# Patient Record
Sex: Female | Born: 1995 | Race: White | Hispanic: No | Marital: Married | Smoking: Never smoker
Health system: Southern US, Community
[De-identification: ages and names within clinical notes are randomized; demographics above are authoritative.]

## PROBLEM LIST (undated history)

## (undated) ENCOUNTER — Ambulatory Visit: Admission: EM | Payer: BC Managed Care – PPO | Source: Home / Self Care

## (undated) ENCOUNTER — Inpatient Hospital Stay (HOSPITAL_COMMUNITY): Payer: Self-pay

## (undated) DIAGNOSIS — M419 Scoliosis, unspecified: Secondary | ICD-10-CM

## (undated) DIAGNOSIS — K5190 Ulcerative colitis, unspecified, without complications: Secondary | ICD-10-CM

## (undated) DIAGNOSIS — Z8739 Personal history of other diseases of the musculoskeletal system and connective tissue: Secondary | ICD-10-CM

## (undated) DIAGNOSIS — D649 Anemia, unspecified: Secondary | ICD-10-CM

## (undated) DIAGNOSIS — F909 Attention-deficit hyperactivity disorder, unspecified type: Secondary | ICD-10-CM

## (undated) HISTORY — PX: VAGINA SURGERY: SHX829

## (undated) HISTORY — DX: Attention-deficit hyperactivity disorder, unspecified type: F90.9

## (undated) HISTORY — PX: COLON SURGERY: SHX602

---

## 2016-01-18 HISTORY — PX: OSTOMY: SHX5997

## 2016-01-18 HISTORY — PX: ABDOMINAL DEBRIDEMENT: SHX1109

## 2016-11-23 DIAGNOSIS — Z933 Colostomy status: Secondary | ICD-10-CM | POA: Insufficient documentation

## 2018-01-17 HISTORY — PX: OTHER SURGICAL HISTORY: SHX169

## 2018-03-07 DIAGNOSIS — K5090 Crohn's disease, unspecified, without complications: Secondary | ICD-10-CM | POA: Insufficient documentation

## 2018-04-11 ENCOUNTER — Encounter (HOSPITAL_COMMUNITY): Payer: Self-pay | Admitting: Psychiatry

## 2018-04-11 ENCOUNTER — Encounter (INDEPENDENT_AMBULATORY_CARE_PROVIDER_SITE_OTHER): Payer: Self-pay

## 2018-04-11 ENCOUNTER — Ambulatory Visit (INDEPENDENT_AMBULATORY_CARE_PROVIDER_SITE_OTHER): Payer: Medicaid Other | Admitting: Psychiatry

## 2018-04-11 ENCOUNTER — Other Ambulatory Visit: Payer: Self-pay

## 2018-04-11 DIAGNOSIS — F988 Other specified behavioral and emotional disorders with onset usually occurring in childhood and adolescence: Secondary | ICD-10-CM | POA: Diagnosis not present

## 2018-04-11 DIAGNOSIS — F900 Attention-deficit hyperactivity disorder, predominantly inattentive type: Secondary | ICD-10-CM | POA: Diagnosis not present

## 2018-04-11 MED ORDER — LISDEXAMFETAMINE DIMESYLATE 30 MG PO CAPS: 30.0000 mg | ORAL_CAPSULE | ORAL | 0 refills | Status: DC

## 2018-04-11 NOTE — Progress Notes (Signed)
Virtual Visit via Video Note  I connected with Laura Vaughn on 04/11/18 at  2:00 PM EDT by a video enabled telemedicine application and verified that I am speaking with the correct person using two identifiers.   I discussed the limitations of evaluation and management by telemedicine and the availability of in person appointments. The patient expressed understanding and agreed to proceed.    I discussed the assessment and treatment plan with the patient. The patient was provided an opportunity to ask questions and all were answered. The patient agreed with the plan and demonstrated an understanding of the instructions.   The patient was advised to call back or seek an in-person evaluation if the symptoms worsen or if the condition fails to improve as anticipated.  I provided 50 minutes of non-face-to-face time during this encounter.   Diannia Ruder, MD   Psychiatric Initial Adult Assessment   Patient Identification: Laura Vaughn MRN:  [id number] Date of Evaluation:  04/11/2018 Referral Source: Nada Boozer PA Chief Complaint:   Chief Complaint    ADD; Establish Care     Visit Diagnosis:    ICD-10-CM   1. Attention deficit hyperactivity disorder (ADHD), predominantly inattentive type F90.0   2. 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  Right now she is staying in [place] while her husband is going to school but she primarily has lived in [place].  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  Right now her courses are online  The patient was referred by Nada Boozer physician assistant at [facility] for assessment and treatment of possible ADD.  The patient states that she was homeschooled for her entire life as were her 2 sisters.  She states that  she had a very difficult time staying focused and on task and that she "barely graduated" she was constantly arguing with her mom about doing work was very unmotivated and "scatterbrained."  She states that she is always had a great deal of difficulty staying on task.  She was never hyperactive violent or destructive.  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  She did very well in the hands on learning but has been struggling with the online courses.  Because of being a full-time parent she has to do most of her schoolwork on weekends and it takes her all day to do things that should only take a couple of hours.  She is easily distracted and off task.  She would like to get to the point where she can focus and get things done more quickly.  She denies any symptoms of depression although she has been through a rather rough time the last couple of years.  When her son was born she had an uneventful labor and and delivery but developed vaginal pain and swelling and infection in the vulvar area 2 days later.  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  She has since developed Crohn's disease.  She is now on Entyvio infusion to try to get the Crohn's under control so that she can have the reanastomosis of the colon.Marland Kitchen  Despite this she tries to stay upbeat and has  a lot of good support from her husband and family.  She is interested in medication to help her stay focused.  She denies any other medical problems or contraindications her last vital signs at [facility] indicate blood pressure of 108/67 and weight of 177 pounds  Associated Signs/Symptoms: Depression Symptoms:  difficulty concentrating, (Hypo) Manic Symptoms:  Distractibility, Anxiety Symptoms:   Psychotic Symptoms:   PTSD Symptoms: No history of trauma or abuse  Past Psychiatric History:  none  Previous Psychotropic Medications: No   Substance Abuse History in the last 12 months:  No.  Consequences of Substance Abuse: Negative  Past Medical History:  Past Medical History:  Diagnosis Date  . ADHD (attention deficit hyperactivity disorder)     Past Surgical History:  Procedure Laterality Date  . COLON SURGERY    . VAGINA SURGERY      Family Psychiatric History: The paternal grandmother is bipolar.  She thinks her sisters probably also have ADD.  They were never diagnosed  Family History:  Family History  Problem Relation Age of Onset  . ADD / ADHD Sister   . Bipolar disorder Paternal Grandmother   . ADD / ADHD Sister     Social History:   Social History   Socioeconomic History  . Marital status: Not on file    Spouse name: Not on file  . Number of children: Not on file  . Years of education: Not on file  . Highest education level: Not on file  Occupational History  . Not on file  Social Needs  . Financial resource strain: Not on file  . Food insecurity:    Worry: Not on file    Inability: Not on file  . Transportation needs:    Medical: Not on file    Non-medical: Not on file  Tobacco Use  . Smoking status: Never Smoker  . Smokeless tobacco: Never Used  Substance and Sexual Activity  . Alcohol use: Never    Frequency: Never  . Drug use: Never  . Sexual activity: Yes    Birth control/protection: Implant  Lifestyle  . Physical activity:    Days per week: Not on file    Minutes per session: Not on file  . Stress: Not on file  Relationships  . Social connections:    Talks on phone: Not on file    Gets together: Not on file    Attends religious service: Not on file    Active member of club or organization: Not on file    Attends meetings of clubs or organizations: Not on file    Relationship status: Not on file  Other Topics Concern  . Not on file  Social History Narrative  . Not on file    Additional Social History: The patient  grew up in [place] [facility] with both parents and 2 older sisters.  She denies any trauma or abuse during childhood.  She is still very close to her family.  She is married and has 1 son.  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  She is currently doing equine studies through community college program.  Allergies:  No Known Allergies  Metabolic Disorder Labs: No results found for: HGBA1C, MPG No results found for: PROLACTIN No results found for: CHOL, TRIG, HDL, CHOLHDL, VLDL, LDLCALC No results found for: TSH  Therapeutic Level Labs: No results found for: LITHIUM No results found for: CBMZ No results found for: VALPROATE  Current  Medications: Current Outpatient Medications  Medication Sig Dispense Refill  . etonogestrel (NEXPLANON) 68 MG IMPL implant Inject into the skin.    Marland Kitchen lisdexamfetamine (VYVANSE) 30 MG capsule Take 1 capsule (30 mg total) by mouth every morning. 30 capsule 0  . Vedolizumab (ENTYVIO IV) Inject into the vein.     No current facility-administered medications for this visit.     Musculoskeletal: Strength & Muscle Tone: within normal limits Gait & Station: normal Patient leans: N/A  Psychiatric Specialty Exam: Review of Systems  Constitutional: Positive for malaise/fatigue.  All other systems reviewed and are negative.   There were no vitals taken for this visit.There is no height or weight on file to calculate BMI.  General Appearance: Casual and Fairly Groomed  Eye Contact:  Good  Speech:  Clear and Coherent  Volume:  Normal  Mood:  Euthymic  Affect:  Appropriate and Congruent  Thought Process:  Goal Directed  Orientation:  Full (Time, Place, and Person)  Thought Content:  WDL  Suicidal Thoughts:  No  Homicidal Thoughts:  No  Memory:  Immediate;   Good Recent;   Good Remote;   Good  Judgement:  Good  Insight:  Fair  Psychomotor Activity:  Normal  Concentration:  Concentration: Poor and Attention  Span: Poor  Recall:  Good  Fund of Knowledge:Good  Language: Good  Akathisia:  No  Handed:  Right  AIMS (if indicated):  not done  Assets:  Communication Skills Desire for Improvement Resilience Social Support Talents/Skills  ADL's:  Intact  Cognition: WNL  Sleep:  Good   Screenings:   Assessment and Plan: This patient is a 23 year old female with a history of probably undiagnosed ADD.  She struggled with this throughout her school years.  Now that she is taking college level courses and has become much more apparent.  We discussed numerous medication options and decided to try Vyvanse 30 mg every morning as this may cover more of a day.  She will start this medication and return to see me in 4 weeks.  Note that this evaluation was done through tele-psychiatry    Diannia Ruder, MD 3/25/20202:36 PM

## 2018-05-09 ENCOUNTER — Encounter (HOSPITAL_COMMUNITY): Payer: Self-pay | Admitting: Psychiatry

## 2018-05-09 ENCOUNTER — Ambulatory Visit (INDEPENDENT_AMBULATORY_CARE_PROVIDER_SITE_OTHER): Payer: Medicaid Other | Admitting: Psychiatry

## 2018-05-09 ENCOUNTER — Other Ambulatory Visit: Payer: Self-pay

## 2018-05-09 DIAGNOSIS — F900 Attention-deficit hyperactivity disorder, predominantly inattentive type: Secondary | ICD-10-CM | POA: Diagnosis not present

## 2018-05-09 MED ORDER — LISDEXAMFETAMINE DIMESYLATE 30 MG PO CAPS: 30.0000 mg | ORAL_CAPSULE | ORAL | 0 refills | Status: DC

## 2018-05-09 NOTE — Progress Notes (Signed)
Virtual Visit via Video Note  I connected with Laura Vaughn on 05/09/18 at 10:20 AM EDT by a video enabled telemedicine application and verified that I am speaking with the correct person using two identifiers.   I discussed the limitations of evaluation and management by telemedicine and the availability of in person appointments. The patient expressed understanding and agreed to proceed.      I discussed the assessment and treatment plan with the patient. The patient was provided an opportunity to ask questions and all were answered. The patient agreed with the plan and demonstrated an understanding of the instructions.   The patient was advised to call back or seek an in-person evaluation if the symptoms worsen or if the condition fails to improve as anticipated.  I provided 15 minutes of non-face-to-face time during this encounter.   Laura Ruder, MD  [facility] MD/PA/NP OP Progress Note  05/09/2018 10:52 AM Laura Vaughn  MRN:  [id number]  Chief Complaint:  Chief Complaint    ADD; Follow-up     HPI: This patient is a 23 year old married white female who lives with her husband and son 45 months old.  Right now she is staying in [place] while her husband is going to school but she primarily has lived in [place].  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  Right now her courses are online  The patient was referred by Laura Vaughn physician assistant at [facility] for assessment and treatment of possible ADD.  The patient states that she was homeschooled for her entire life as were her 2 sisters.  She states that she had a very difficult time staying focused and on task and that she "barely graduated" she was constantly arguing with her mom about doing work was very unmotivated and "scatterbrained."  She states that she is always had a great deal of difficulty staying on task.  She was never hyperactive  violent or destructive.  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  She did very well in the hands on learning but has been struggling with the online courses.  Because of being a full-time parent she has to do most of her schoolwork on weekends and it takes her all day to do things that should only take a couple of hours.  She is easily distracted and off task.  She would like to get to the point where she can focus and get things done more quickly.  She denies any symptoms of depression although she has been through a rather rough time the last couple of years.  When her son was born she had an uneventful labor and and delivery but developed vaginal pain and swelling and infection in the vulvar area 2 days later.  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  She has since developed Crohn's disease.  She is now on Entyvio infusion to try to get the Crohn's under control so that she can have the reanastomosis of the colon.Marland Kitchen  Despite this she tries to stay upbeat and has a lot of good support from her husband and family.  She is interested in medication to help her stay focused.  She denies any other medical problems or contraindications her last vital signs at [facility] indicate blood pressure of 108/67 and weight of  177 pounds  The patient returns after 4 weeks.  She is seen via video telemedicine due to the coronavirus pandemic.  She states that the Vyvanse 30 mg every morning is helping her focus when she has schoolwork.  She does feel slightly anxious in the evenings when she takes it but it is nothing that she cannot live with and she does not feel overwhelmed by it.  She is not using it every day.  She states that she is going to be staying with her husband in [place] and then they are going to an internship [place] has to [place]  I urged her to find a provider closer to where she is staying but she states her ultimate plan is to return to the [place] area.  For now I suggested that we continue the Vyvanse for the next 3 months at least.  If she is having any further problems or issues she will let me know. Visit Diagnosis:    ICD-10-CM   1. Attention deficit hyperactivity disorder (ADHD), predominantly inattentive type F90.0     Past Psychiatric History: none  Past Medical History:  Past Medical History:  Diagnosis Date  . ADHD (attention deficit hyperactivity disorder)     Past Surgical History:  Procedure Laterality Date  . COLON SURGERY    . VAGINA SURGERY      Family Psychiatric History: see below  Family History:  Family History  Problem Relation Age of Onset  . ADD / ADHD Sister   . Bipolar disorder Paternal Grandmother   . ADD / ADHD Sister     Social History:  Social History   Socioeconomic History  . Marital status: Unknown    Spouse name: Not on file  . Number of children: Not on file  . Years of education: Not on file  . Highest education level: Not on file  Occupational History  . Not on file  Social Needs  . Financial resource strain: Not on file  . Food insecurity:    Worry: Not on file    Inability: Not on file  . Transportation needs:    Medical: Not on file    Non-medical: Not on file  Tobacco Use  . Smoking status: Never Smoker  . Smokeless tobacco: Never Used  Substance and Sexual Activity  . Alcohol use: Never    Frequency: Never  . Drug use: Never  . Sexual activity: Yes    Birth control/protection: Implant  Lifestyle  . Physical activity:    Days per week: Not on file    Minutes per session: Not on file  . Stress: Not on file  Relationships  . Social connections:    Talks on phone: Not on file    Gets together: Not on file    Attends religious service: Not on file    Active member of club or organization: Not on file    Attends meetings of  clubs or organizations: Not on file    Relationship status: Not on file  Other Topics Concern  . Not on file  Social History Narrative  . Not on file    Allergies: No Known Allergies  Metabolic Disorder Labs: No results found for: HGBA1C, MPG No results found for: PROLACTIN No results found for: CHOL, TRIG, HDL, CHOLHDL, VLDL, LDLCALC No results found for: TSH  Therapeutic Level Labs: No results found for: LITHIUM No results found for: VALPROATE No components found for:  CBMZ  Current Medications:  Current Outpatient Medications  Medication Sig Dispense Refill  . etonogestrel (NEXPLANON) 68 MG IMPL implant Inject into the skin.    Marland Kitchen lisdexamfetamine (VYVANSE) 30 MG capsule Take 1 capsule (30 mg total) by mouth every morning. 30 capsule 0  . lisdexamfetamine (VYVANSE) 30 MG capsule Take 1 capsule (30 mg total) by mouth every morning. Fill after 06/07/2018 30 capsule 0  . lisdexamfetamine (VYVANSE) 30 MG capsule Take 1 capsule (30 mg total) by mouth every morning. Fill after 07/08/2018 30 capsule 0  . Vedolizumab (ENTYVIO IV) Inject into the vein.     No current facility-administered medications for this visit.      Musculoskeletal: Strength & Muscle Tone: within normal limits Gait & Station: normal Patient leans: N/A  Psychiatric Specialty Exam: Review of Systems  All other systems reviewed and are negative.   There were no vitals taken for this visit.There is no height or weight on file to calculate BMI.  General Appearance: Casual and Fairly Groomed  Eye Contact:  Good  Speech:  Clear and Coherent  Volume:  Normal  Mood:  Euthymic  Affect:  Appropriate and Congruent  Thought Process:  Goal Directed  Orientation:  Full (Time, Place, and Person)  Thought Conte normal  Suicidal Thoughts:  No  Homicidal Thoughts:  No  Memory:  Immediate;   Good Recent;   Good Remote;   Good  Judgement:  Good  Insight:  Fair  Psychomotor Activity:  Normal  Concentration:   Concentration: Good and Attention Span: Good  Recall:  Good  Fund of Knowledge: Good  Language: Good  Akathisia:  No  Handed:  Right  AIMS (if indicated): not done  Assets:  Communication Skills Desire for Improvement Physical Health Resilience Social Support Talents/Skills  ADL's:  Intact  Cognition: WNL  Sleep:  Good   Screenings:   Assessment and Plan:  This patient is a 23 year old female with a history of ADD.  She is doing well with Vyvanse 30 mg every morning with minimal side effects.  She will continue this dosage and return to see me in 3 months  Laura Ruder, MD 05/09/2018, 10:52 AM

## 2018-08-08 ENCOUNTER — Ambulatory Visit (HOSPITAL_COMMUNITY): Payer: Medicaid Other | Admitting: Psychiatry

## 2018-12-04 LAB — OB RESULTS CONSOLE RPR: RPR: NONREACTIVE

## 2018-12-04 LAB — OB RESULTS CONSOLE ABO/RH: RH Type: POSITIVE

## 2018-12-04 LAB — OB RESULTS CONSOLE ANTIBODY SCREEN: Antibody Screen: NEGATIVE

## 2018-12-04 LAB — OB RESULTS CONSOLE GC/CHLAMYDIA
Chlamydia: NEGATIVE
Gonorrhea: NEGATIVE

## 2018-12-04 LAB — OB RESULTS CONSOLE RUBELLA ANTIBODY, IGM: Rubella: IMMUNE

## 2018-12-04 LAB — OB RESULTS CONSOLE HEPATITIS B SURFACE ANTIGEN: Hepatitis B Surface Ag: NEGATIVE

## 2018-12-04 LAB — OB RESULTS CONSOLE HIV ANTIBODY (ROUTINE TESTING): HIV: NONREACTIVE

## 2019-05-23 ENCOUNTER — Inpatient Hospital Stay (HOSPITAL_COMMUNITY)
Admission: AD | Admit: 2019-05-23 | Discharge: 2019-05-23 | Disposition: A | Discharge status: Home / Self Care | Payer: Medicaid Other | Source: Ambulatory Visit | Attending: Obstetrics and Gynecology | Admitting: Obstetrics and Gynecology

## 2019-05-23 ENCOUNTER — Other Ambulatory Visit: Payer: Self-pay

## 2019-05-23 ENCOUNTER — Encounter (HOSPITAL_COMMUNITY): Payer: Self-pay | Admitting: Obstetrics and Gynecology

## 2019-05-23 DIAGNOSIS — R197 Diarrhea, unspecified: Secondary | ICD-10-CM

## 2019-05-23 DIAGNOSIS — O98813 Other maternal infectious and parasitic diseases complicating pregnancy, third trimester: Secondary | ICD-10-CM

## 2019-05-23 DIAGNOSIS — Z3A33 33 weeks gestation of pregnancy: Secondary | ICD-10-CM

## 2019-05-23 DIAGNOSIS — A084 Viral intestinal infection, unspecified: Secondary | ICD-10-CM | POA: Diagnosis not present

## 2019-05-23 DIAGNOSIS — Z3689 Encounter for other specified antenatal screening: Secondary | ICD-10-CM | POA: Diagnosis not present

## 2019-05-23 DIAGNOSIS — O99891 Other specified diseases and conditions complicating pregnancy: Secondary | ICD-10-CM

## 2019-05-23 DIAGNOSIS — O99613 Diseases of the digestive system complicating pregnancy, third trimester: Secondary | ICD-10-CM | POA: Diagnosis not present

## 2019-05-23 LAB — URINALYSIS, ROUTINE W REFLEX MICROSCOPIC
Bilirubin Urine: NEGATIVE
Glucose, UA: NEGATIVE mg/dL
Hgb urine dipstick: NEGATIVE
Ketones, ur: 20 mg/dL — AB
Nitrite: NEGATIVE
Protein, ur: 100 mg/dL — AB
Specific Gravity, Urine: 1.029 (ref 1.005–1.030)
pH: 6 (ref 5.0–8.0)

## 2019-05-23 LAB — CBC WITH DIFFERENTIAL/PLATELET
Abs Immature Granulocytes: 0.07 10*3/uL (ref 0.00–0.07)
Basophils Absolute: 0 10*3/uL (ref 0.0–0.1)
Basophils Relative: 0 %
Eosinophils Absolute: 0 10*3/uL (ref 0.0–0.5)
Eosinophils Relative: 0 %
HCT: 37.4 % (ref 36.0–46.0)
Hemoglobin: 11.4 g/dL — ABNORMAL LOW (ref 12.0–15.0)
Immature Granulocytes: 1 %
Lymphocytes Relative: 15 %
Lymphs Abs: 1.7 10*3/uL (ref 0.7–4.0)
MCH: 27.5 pg (ref 26.0–34.0)
MCHC: 30.5 g/dL (ref 30.0–36.0)
MCV: 90.3 fL (ref 80.0–100.0)
Monocytes Absolute: 0.6 10*3/uL (ref 0.1–1.0)
Monocytes Relative: 6 %
Neutro Abs: 8.4 10*3/uL — ABNORMAL HIGH (ref 1.7–7.7)
Neutrophils Relative %: 78 %
Platelets: 425 10*3/uL — ABNORMAL HIGH (ref 150–400)
RBC: 4.14 MIL/uL (ref 3.87–5.11)
RDW: 14.2 % (ref 11.5–15.5)
WBC: 10.8 10*3/uL — ABNORMAL HIGH (ref 4.0–10.5)
nRBC: 0 % (ref 0.0–0.2)

## 2019-05-23 LAB — COMPREHENSIVE METABOLIC PANEL
ALT: 15 U/L (ref 0–44)
AST: 23 U/L (ref 15–41)
Albumin: 2.5 g/dL — ABNORMAL LOW (ref 3.5–5.0)
Alkaline Phosphatase: 112 U/L (ref 38–126)
Anion gap: 7 (ref 5–15)
BUN: 6 mg/dL (ref 6–20)
CO2: 21 mmol/L — ABNORMAL LOW (ref 22–32)
Calcium: 8 mg/dL — ABNORMAL LOW (ref 8.9–10.3)
Chloride: 107 mmol/L (ref 98–111)
Creatinine, Ser: 0.68 mg/dL (ref 0.44–1.00)
GFR calc Af Amer: >60 mL/min (ref >60)
GFR calc non Af Amer: >60 mL/min (ref >60)
Glucose, Bld: 84 mg/dL (ref 70–99)
Potassium: 3.8 mmol/L (ref 3.5–5.1)
Sodium: 135 mmol/L (ref 135–145)
Total Bilirubin: 0.5 mg/dL (ref 0.3–1.2)
Total Protein: 6.6 g/dL (ref 6.5–8.1)

## 2019-05-23 MED ORDER — LACTATED RINGERS IV BOLUS
1000.0000 mL | Freq: Once | INTRAVENOUS | Status: AC
  Administered 2019-05-23: 17:00:00 1000 mL via INTRAVENOUS

## 2019-05-23 MED ORDER — DIPHENOXYLATE-ATROPINE 2.5-0.025 MG PO TABS: 1.0000 | ORAL_TABLET | Freq: Four times a day (QID) | ORAL | 0 refills | Status: DC | PRN

## 2019-05-23 MED ORDER — M.V.I. ADULT IV INJ
Freq: Once | INTRAVENOUS | Status: AC
  Filled 2019-05-23: qty 10

## 2019-05-23 NOTE — MAU Note (Signed)
Got a stomach bug on Monday, no longer throwing up; but stomach is in knots.  Still having diarrhea. Is concerned that it may be c-diff, has had it before. (watery stools, 3-4/day)

## 2019-05-23 NOTE — MAU Provider Note (Signed)
History     CSN: [id number]  Arrival date and time: 05/23/19 1331   First Provider Initiated Contact with Patient 05/23/19 1554      Chief Complaint  Patient presents with  . Abdominal Pain  . Diarrhea   Laura Vaughn is a 24 y.o. G2P1 at 48w6dwho presents to MAU with complaints of diarrhea. Patient reports she has a stomach bug on Monday and was having N/V and diarrhea. She reports nausea and vomiting resolved on Tuesday. Since then diarrhea has continued. She reports 3-4 occurrences of diarrhea a day since Monday. Has a hx of C diff 1-2 years ago per patient so is concerned that it may be C diff. She denies recent use of antibiotics or change in medication. Patient has a hx of ulcerative colitis. She reports +FM. She denies abdominal pain, pelvic pain or contractions. Patient reports stomach is in "knots since stomach bug". Patient reports she is tired and weak like she is dehydrated.    OB History    Gravida  2   Para  1   Term  1   Preterm      AB      Living  1     SAB      TAB      Ectopic      Multiple      Live Births  1           Past Medical History:  Diagnosis Date  . ADHD (attention deficit hyperactivity disorder)     Past Surgical History:  Procedure Laterality Date  . ABDOMINAL DEBRIDEMENT  2018   x7  . COLON SURGERY    . OSTOMY  2018  . ostomy reversal  2020  . VAGINA SURGERY      Family History  Problem Relation Age of Onset  . ADD / ADHD Sister   . Bipolar disorder Paternal Grandmother   . ADD / ADHD Sister     Social History   Tobacco Use  . Smoking status: Never Smoker  . Smokeless tobacco: Never Used  Substance Use Topics  . Alcohol use: Never  . Drug use: Never    Allergies: No Known Allergies  Medications Prior to Admission  Medication Sig Dispense Refill Last Dose  . acetaminophen (TYLENOL) 325 MG tablet Take 650 mg by mouth at bedtime.   05/22/2019 at Unknown time  . mesalamine (LIALDA) 1.2 g EC tablet Take 1.2 g  by mouth daily with breakfast.   05/22/2019 at Unknown time  . Prenatal Vit-Fe Fumarate-FA (PRENATAL PO) Take 1 tablet by mouth daily.   05/22/2019 at Unknown time  . lisdexamfetamine (VYVANSE) 30 MG capsule Take 1 capsule (30 mg total) by mouth every morning. (Patient not taking: Reported on 05/23/2019) 30 capsule 0 Not Taking at Unknown time  . lisdexamfetamine (VYVANSE) 30 MG capsule Take 1 capsule (30 mg total) by mouth every morning. Fill after 06/07/2018 (Patient not taking: Reported on 05/23/2019) 30 capsule 0 Not Taking at Unknown time  . lisdexamfetamine (VYVANSE) 30 MG capsule Take 1 capsule (30 mg total) by mouth every morning. Fill after 07/08/2018 (Patient not taking: Reported on 05/23/2019) 30 capsule 0 Not Taking at Unknown time    Review of Systems  Constitutional: Negative.   Respiratory: Negative.   Cardiovascular: Negative.   Gastrointestinal: Positive for diarrhea. Negative for abdominal pain, nausea and vomiting.  Genitourinary: Negative.   Musculoskeletal: Negative.   Neurological: Positive for weakness. Negative for dizziness, light-headedness and headaches.  Psychiatric/Behavioral: Negative.    Physical Exam   Blood pressure 103/63, pulse 95, temperature 98.4 F (36.9 C), temperature source Oral, resp. rate 16, height 5' 8"  (1.727 m), weight 90.2 kg, SpO2 100 %.  Physical Exam  Nursing note and vitals reviewed. Constitutional: She is oriented to person, place, and time. She appears well-developed and well-nourished. No distress.  Cardiovascular: Normal rate and regular rhythm.  Respiratory: Effort normal and breath sounds normal. No respiratory distress. She has no wheezes. She has no rales.  GI: Soft. There is no abdominal tenderness. There is no rebound and no guarding.  Gravid appropriate for gestational age  Musculoskeletal:        General: No edema. Normal range of motion.  Neurological: She is alert and oriented to person, place, and time.  Skin: Skin is warm and  dry. There is pallor.  Psychiatric: She has a normal mood and affect. Her behavior is normal. Thought content normal.   FHR: 130/moderate/+accels/ no decelerations  Toco: no UC, UI prior to rehydration   MAU Course  Procedures  MDM Orders Placed This Encounter  Procedures  . Urinalysis, Routine w reflex microscopic  . CBC with Differential/Platelet  . Comprehensive metabolic panel  . Enteric precautions (UV disinfection)  . Insert peripheral IV   Meds ordered this encounter  Medications  . lactated ringers bolus 1,000 mL  . multivitamins adult (INFUVITE ADULT) 10 mL in lactated ringers 1,000 mL infusion  . diphenoxylate-atropine (LOMOTIL) 2.5-0.025 MG tablet    Sig: Take 1 tablet by mouth 4 (four) times daily as needed for diarrhea or loose stools.    Dispense:  15 tablet    Refill:  0    Order Specific Question:   Supervising Provider    Answer:   Verita Schneiders A [[id number]]  Labs reviewed and WNL  UA ketones present  NST reactive and reassuring   Treatments in MAU including IV LR bolus and MV bag for rehydration. Patient reports last occurrence of diarrhea this morning. Has not had any occurrence since being in MAU- unlikely to be C Diff.   Discussed reasons to return to MAU. Follow up as scheduled in the office. Return to MAU as needed. Pt stable at time of discharge. Rx for Lomotil given to patient for home use if diarrhea continues. Encouraged patient to eat BRAT diet until diarrhea resolves   Assessment and Plan   1. Diarrhea with dehydration   2. [redacted] weeks gestation of pregnancy   3. NST (non-stress test) reactive   4. Viral gastroenteritis    Discharge home Follow up as scheduled in the office for prenatal care Return to MAU as needed for reasons discussed and/or emergencies  Rx for Lomotil   Follow-up Information    [facility] Go on 05/30/2019.   Why: Follow up as scheduled for prenatal care and return to MAU as needed  Contact  information: [address]  [address] [place] [address] [phone]          Allergies as of 05/23/2019   No Known Allergies     Medication List    STOP taking these medications   lisdexamfetamine 30 MG capsule Commonly known as: Vyvanse     TAKE these medications   acetaminophen 325 MG tablet Commonly known as: TYLENOL Take 650 mg by mouth at bedtime.   diphenoxylate-atropine 2.5-0.025 MG tablet Commonly known as: Lomotil Take 1 tablet by mouth 4 (four) times daily as needed for diarrhea or loose stools.  mesalamine 1.2 g EC tablet Commonly known as: LIALDA Take 1.2 g by mouth daily with breakfast.   PRENATAL PO Take 1 tablet by mouth daily.       Lajean Manes CNM 05/23/2019, 5:52 PM

## 2019-06-13 LAB — OB RESULTS CONSOLE GBS: GBS: NEGATIVE

## 2019-06-21 ENCOUNTER — Encounter (HOSPITAL_COMMUNITY): Payer: Self-pay

## 2019-06-21 ENCOUNTER — Telehealth (HOSPITAL_COMMUNITY): Payer: Self-pay | Admitting: *Deleted

## 2019-06-21 NOTE — Patient Instructions (Addendum)
Laura Vaughn  06/21/2019   Your procedure is scheduled on:  06/28/2019  Arrive at [place] at [employer] [facility] on [address] at [facility]  and [employer]. You are invited to use the FREE valet parking or use the Visitor's parking deck.  Pick up the phone at the desk and dial [phone].  Call this number if you have problems the morning of surgery: [phone]  Remember:   Do not eat food:(After Midnight) Desps de medianoche.  Do not drink clear liquids: (After Midnight) Desps de medianoche.  Take these medicines the morning of surgery with A SIP OF WATER:  Do not take Lialda on the day of surgery.   Do not wear jewelry, make-up or nail polish.  Do not wear lotions, powders, or perfumes. Do not wear deodorant.  Do not shave 48 hours prior to surgery.  Do not bring valuables to the hospital.  [facility] is not   responsible for any belongings or valuables brought to the hospital.  Contacts, dentures or bridgework may not be worn into surgery.  Leave suitcase in the car. After surgery it may be brought to your room.  For patients admitted to the hospital, checkout time is 11:00 AM the day of              discharge.      Please read over the following fact sheets that you were given:     Preparing for Surgery

## 2019-06-21 NOTE — Telephone Encounter (Signed)
Preadmission screen  

## 2019-06-21 NOTE — Pre-Procedure Instructions (Signed)
Per Dr Laura Vaughn pt is not to take lialda on the day of surgery.  Will resume day after surgery

## 2019-06-24 ENCOUNTER — Encounter (HOSPITAL_COMMUNITY): Payer: Self-pay

## 2019-06-26 ENCOUNTER — Other Ambulatory Visit (HOSPITAL_COMMUNITY)
Admission: RE | Admit: 2019-06-26 | Discharge: 2019-06-26 | Disposition: A | Discharge status: Home / Self Care | Payer: Commercial Managed Care - PPO | Source: Ambulatory Visit | Attending: Obstetrics and Gynecology | Admitting: Obstetrics and Gynecology

## 2019-06-26 HISTORY — DX: Ulcerative colitis, unspecified, without complications: K51.90

## 2019-06-26 HISTORY — DX: Anemia, unspecified: D64.9

## 2019-06-26 HISTORY — DX: Personal history of other diseases of the musculoskeletal system and connective tissue: Z87.39

## 2019-06-27 ENCOUNTER — Other Ambulatory Visit: Payer: Self-pay

## 2019-06-27 ENCOUNTER — Encounter (HOSPITAL_COMMUNITY): Payer: Self-pay | Admitting: Obstetrics and Gynecology

## 2019-06-27 ENCOUNTER — Other Ambulatory Visit (HOSPITAL_COMMUNITY)
Admission: RE | Admit: 2019-06-27 | Discharge: 2019-06-27 | Disposition: A | Discharge status: Home / Self Care | Payer: Commercial Managed Care - PPO | Source: Ambulatory Visit | Attending: Obstetrics and Gynecology | Admitting: Obstetrics and Gynecology

## 2019-06-27 DIAGNOSIS — Z20822 Contact with and (suspected) exposure to covid-19: Secondary | ICD-10-CM | POA: Insufficient documentation

## 2019-06-27 LAB — CBC
HCT: 35.6 % — ABNORMAL LOW (ref 36.0–46.0)
Hemoglobin: 11.1 g/dL — ABNORMAL LOW (ref 12.0–15.0)
MCH: 26.4 pg (ref 26.0–34.0)
MCHC: 31.2 g/dL (ref 30.0–36.0)
MCV: 84.6 fL (ref 80.0–100.0)
Platelets: 512 10*3/uL — ABNORMAL HIGH (ref 150–400)
RBC: 4.21 MIL/uL (ref 3.87–5.11)
RDW: 15.5 % (ref 11.5–15.5)
WBC: 13.3 10*3/uL — ABNORMAL HIGH (ref 4.0–10.5)
nRBC: 0 % (ref 0.0–0.2)

## 2019-06-27 LAB — ABO/RH: ABO/RH(D): O POS

## 2019-06-27 LAB — RPR: RPR Ser Ql: NONREACTIVE

## 2019-06-27 LAB — TYPE AND SCREEN
ABO/RH(D): O POS
Antibody Screen: NEGATIVE

## 2019-06-27 LAB — SARS CORONAVIRUS 2 (TAT 6-24 HRS): SARS Coronavirus 2: NEGATIVE

## 2019-06-27 NOTE — MAU Note (Signed)
Asymptomatic, swab collected. Lab here for blood work

## 2019-06-27 NOTE — H&P (Signed)
Laura Vaughn is a 24 y.o. female, G2 P1001, EGA [redacted] weeks with EDC 6-18 presenting for primary c-section.  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  Pregnancy has been otherwise complicated by ulcerative colitis fairly well controlled with Lialda.  OB History    Gravida  2   Para  1   Term  1   Preterm      AB      Living  1     SAB      TAB      Ectopic      Multiple      Live Births  1          Past Medical History:  Diagnosis Date  . ADHD (attention deficit hyperactivity disorder)   . Anemia   . History of necrotizing fasciitis    vulvar and vaginal after last delivery  . Ulcerative colitis [facility])    Past Surgical History:  Procedure Laterality Date  . ABDOMINAL DEBRIDEMENT  2018   x7  . COLON SURGERY    . OSTOMY  2018  . ostomy reversal  2020  . VAGINA SURGERY     Family History: family history includes ADD / ADHD in her sister and sister; Bipolar disorder in her paternal grandmother; Diabetes in her father. Social History:  reports that she has never smoked. She has never used smokeless tobacco. She reports that she does not drink alcohol and does not use drugs.     Maternal Diabetes: No Genetic Screening: Normal Maternal Ultrasounds/Referrals: Normal Fetal Ultrasounds or other Referrals:  None Maternal Substance Abuse:  No Significant Maternal Medications:  Meds include: Other:  Significant Maternal Lab Results:  Group B Strep negative Other Comments:  Lialda for UC  Review of Systems  Respiratory: Negative.   Cardiovascular: Negative.    Maternal Medical History:  Fetal activity: Perceived fetal activity is normal.    Prenatal complications: no prenatal complications Prenatal Complications - Diabetes: none.      There were no vitals taken for this visit. Maternal Exam:  Abdomen: Patient reports no  abdominal tenderness. Fetal presentation: vertex  Introitus: Normal vagina.  Amniotic fluid character: not assessed. Scarring from previous partial vulvovaginectomy  Pelvis: adequate for delivery.      Physical Exam  Cardiovascular: Normal rate, regular rhythm and normal heart sounds.  No murmur heard. Respiratory: Effort normal and breath sounds normal.  GI: Soft. Normal appearance.  Musculoskeletal:     Cervical back: Neck supple.  Neurological: She is alert.    Prenatal labs: ABO, Rh: --/--/O POS, O POS Performed at [facility], [place] [address] [place], [place] [address]  [phone]) Antibody: NEG (06/10 0813) Rubella: Immune (11/17 0000) RPR: NON REACTIVE (06/10 0813)  HBsAg: Negative (11/17 0000)  HIV: Non-reactive (11/17 0000)  GBS: Negative/-- (05/27 0000)   Assessment/Plan: IUP at 39 weeks, previous vulvovaginal necrotizing fasciitis requiring partial vulvovaginectomy and diverting colostomy.  Discussed c-section procedure and risks, admitting for elective c-section.   Laura Vaughn 06/27/2019, 6:54 PM

## 2019-06-28 ENCOUNTER — Other Ambulatory Visit: Payer: Self-pay

## 2019-06-28 ENCOUNTER — Inpatient Hospital Stay (HOSPITAL_COMMUNITY)
Admission: RE | Admit: 2019-06-28 | Discharge: 2019-06-30 | DRG: 787 | Disposition: A | Discharge status: Home / Self Care | Payer: Commercial Managed Care - PPO | Attending: Obstetrics and Gynecology | Admitting: Obstetrics and Gynecology

## 2019-06-28 ENCOUNTER — Inpatient Hospital Stay (HOSPITAL_COMMUNITY): Payer: Commercial Managed Care - PPO

## 2019-06-28 ENCOUNTER — Encounter (HOSPITAL_COMMUNITY): Payer: Self-pay | Admitting: Obstetrics and Gynecology

## 2019-06-28 ENCOUNTER — Encounter (HOSPITAL_COMMUNITY)
Admission: RE | Disposition: A | Discharge status: Home / Self Care | Payer: Self-pay | Source: Home / Self Care | Attending: Obstetrics and Gynecology

## 2019-06-28 DIAGNOSIS — Z933 Colostomy status: Secondary | ICD-10-CM

## 2019-06-28 DIAGNOSIS — K5190 Ulcerative colitis, unspecified, without complications: Secondary | ICD-10-CM | POA: Diagnosis present

## 2019-06-28 DIAGNOSIS — Z3A39 39 weeks gestation of pregnancy: Secondary | ICD-10-CM | POA: Diagnosis not present

## 2019-06-28 DIAGNOSIS — O9962 Diseases of the digestive system complicating childbirth: Secondary | ICD-10-CM | POA: Diagnosis present

## 2019-06-28 DIAGNOSIS — D62 Acute posthemorrhagic anemia: Secondary | ICD-10-CM | POA: Diagnosis not present

## 2019-06-28 DIAGNOSIS — Z20822 Contact with and (suspected) exposure to covid-19: Secondary | ICD-10-CM | POA: Diagnosis present

## 2019-06-28 DIAGNOSIS — Z98891 History of uterine scar from previous surgery: Secondary | ICD-10-CM

## 2019-06-28 DIAGNOSIS — O9081 Anemia of the puerperium: Secondary | ICD-10-CM | POA: Diagnosis not present

## 2019-06-28 DIAGNOSIS — O26893 Other specified pregnancy related conditions, third trimester: Principal | ICD-10-CM | POA: Diagnosis present

## 2019-06-28 SURGERY — Surgical Case
Anesthesia: Spinal

## 2019-06-28 MED ORDER — SCOPOLAMINE 1 MG/3DAYS TD PT72
MEDICATED_PATCH | TRANSDERMAL | Status: AC
  Filled 2019-06-28: qty 1

## 2019-06-28 MED ORDER — STERILE WATER FOR IRRIGATION IR SOLN
Status: DC | PRN
  Administered 2019-06-28: 1000 mL

## 2019-06-28 MED ORDER — COCONUT OIL OIL: 1.0000 "application " | TOPICAL_OIL | Status: DC | PRN

## 2019-06-28 MED ORDER — SODIUM CHLORIDE 0.9 % IV SOLN
INTRAVENOUS | Status: DC | PRN
Start: 2019-06-28 — End: 2019-06-28

## 2019-06-28 MED ORDER — DIPHENHYDRAMINE HCL 25 MG PO CAPS: 25.0000 mg | ORAL_CAPSULE | ORAL | Status: DC | PRN

## 2019-06-28 MED ORDER — DIPHENHYDRAMINE HCL 25 MG PO CAPS: 25.0000 mg | ORAL_CAPSULE | Freq: Four times a day (QID) | ORAL | Status: DC | PRN

## 2019-06-28 MED ORDER — NALBUPHINE HCL 10 MG/ML IJ SOLN: 5.0000 mg | INTRAMUSCULAR | Status: DC | PRN

## 2019-06-28 MED ORDER — OXYTOCIN-SODIUM CHLORIDE 30-0.9 UT/500ML-% IV SOLN
INTRAVENOUS | Status: AC
  Filled 2019-06-28: qty 500

## 2019-06-28 MED ORDER — DIPHENHYDRAMINE HCL 50 MG/ML IJ SOLN
INTRAMUSCULAR | Status: AC
  Filled 2019-06-28: qty 1

## 2019-06-28 MED ORDER — LACTATED RINGERS IV SOLN: INTRAVENOUS | Status: DC

## 2019-06-28 MED ORDER — ONDANSETRON HCL 4 MG/2ML IJ SOLN: 4.0000 mg | Freq: Three times a day (TID) | INTRAMUSCULAR | Status: DC | PRN

## 2019-06-28 MED ORDER — SIMETHICONE 80 MG PO CHEW
80.0000 mg | CHEWABLE_TABLET | ORAL | Status: DC | PRN
  Administered 2019-06-29: 80 mg via ORAL
  Filled 2019-06-28: qty 1

## 2019-06-28 MED ORDER — OXYCODONE HCL 5 MG/5ML PO SOLN: 5.0000 mg | Freq: Once | ORAL | Status: DC | PRN

## 2019-06-28 MED ORDER — ACETAMINOPHEN 500 MG PO TABS
1000.0000 mg | ORAL_TABLET | Freq: Four times a day (QID) | ORAL | Status: DC
  Administered 2019-06-28 – 2019-06-30 (×7): 1000 mg via ORAL
  Filled 2019-06-28 (×8): qty 2

## 2019-06-28 MED ORDER — METHYLERGONOVINE MALEATE 0.2 MG/ML IJ SOLN: 0.2000 mg | INTRAMUSCULAR | Status: DC | PRN

## 2019-06-28 MED ORDER — FENTANYL CITRATE (PF) 100 MCG/2ML IJ SOLN
INTRAMUSCULAR | Status: DC | PRN
  Administered 2019-06-28: 15 ug via INTRATHECAL

## 2019-06-28 MED ORDER — KETOROLAC TROMETHAMINE 30 MG/ML IJ SOLN
30.0000 mg | Freq: Once | INTRAMUSCULAR | Status: AC | PRN
  Administered 2019-06-28: 30 mg via INTRAVENOUS

## 2019-06-28 MED ORDER — MENTHOL 3 MG MT LOZG: 1.0000 | LOZENGE | OROMUCOSAL | Status: DC | PRN

## 2019-06-28 MED ORDER — ONDANSETRON HCL 4 MG/2ML IJ SOLN
INTRAMUSCULAR | Status: DC | PRN
  Administered 2019-06-28: 4 mg via INTRAVENOUS

## 2019-06-28 MED ORDER — DEXAMETHASONE SODIUM PHOSPHATE 10 MG/ML IJ SOLN
INTRAMUSCULAR | Status: DC | PRN
  Administered 2019-06-28: 10 mg via INTRAVENOUS

## 2019-06-28 MED ORDER — FENTANYL CITRATE (PF) 100 MCG/2ML IJ SOLN
INTRAMUSCULAR | Status: AC
  Filled 2019-06-28: qty 2

## 2019-06-28 MED ORDER — SODIUM CHLORIDE 0.9 % IR SOLN
Status: DC | PRN
  Administered 2019-06-28: 1

## 2019-06-28 MED ORDER — IBUPROFEN 800 MG PO TABS
800.0000 mg | ORAL_TABLET | Freq: Three times a day (TID) | ORAL | Status: DC
  Administered 2019-06-29 – 2019-06-30 (×4): 800 mg via ORAL
  Filled 2019-06-28: qty 4
  Filled 2019-06-28 (×3): qty 1

## 2019-06-28 MED ORDER — TETANUS-DIPHTH-ACELL PERTUSSIS 5-2.5-18.5 LF-MCG/0.5 IM SUSP: 0.5000 mL | Freq: Once | INTRAMUSCULAR | Status: DC

## 2019-06-28 MED ORDER — NALBUPHINE HCL 10 MG/ML IJ SOLN: 5.0000 mg | Freq: Once | INTRAMUSCULAR | Status: AC | PRN

## 2019-06-28 MED ORDER — WITCH HAZEL-GLYCERIN EX PADS: 1.0000 "application " | MEDICATED_PAD | CUTANEOUS | Status: DC | PRN

## 2019-06-28 MED ORDER — OXYTOCIN-SODIUM CHLORIDE 30-0.9 UT/500ML-% IV SOLN
INTRAVENOUS | Status: DC | PRN
  Administered 2019-06-28: 30 [IU] via INTRAVENOUS

## 2019-06-28 MED ORDER — MEASLES, MUMPS & RUBELLA VAC IJ SOLR: 0.5000 mL | Freq: Once | INTRAMUSCULAR | Status: DC

## 2019-06-28 MED ORDER — KETOROLAC TROMETHAMINE 30 MG/ML IJ SOLN
30.0000 mg | Freq: Four times a day (QID) | INTRAMUSCULAR | Status: AC
  Administered 2019-06-28 – 2019-06-29 (×2): 30 mg via INTRAVENOUS
  Filled 2019-06-28 (×2): qty 1

## 2019-06-28 MED ORDER — NALOXONE HCL 0.4 MG/ML IJ SOLN: 0.4000 mg | INTRAMUSCULAR | Status: DC | PRN

## 2019-06-28 MED ORDER — SENNOSIDES-DOCUSATE SODIUM 8.6-50 MG PO TABS
2.0000 | ORAL_TABLET | ORAL | Status: DC
  Administered 2019-06-29 (×2): 2 via ORAL
  Filled 2019-06-28 (×2): qty 2

## 2019-06-28 MED ORDER — DIPHENHYDRAMINE HCL 50 MG/ML IJ SOLN
12.5000 mg | INTRAMUSCULAR | Status: DC | PRN
  Administered 2019-06-28: 12.5 mg via INTRAVENOUS

## 2019-06-28 MED ORDER — OXYTOCIN-SODIUM CHLORIDE 30-0.9 UT/500ML-% IV SOLN
2.5000 [IU]/h | INTRAVENOUS | Status: DC
  Administered 2019-06-28: 2.5 [IU]/h via INTRAVENOUS

## 2019-06-28 MED ORDER — NALOXONE HCL 4 MG/10ML IJ SOLN
1.0000 ug/kg/h | INTRAVENOUS | Status: DC | PRN
  Filled 2019-06-28: qty 5

## 2019-06-28 MED ORDER — PHENYLEPHRINE HCL-NACL 20-0.9 MG/250ML-% IV SOLN
INTRAVENOUS | Status: DC | PRN
  Administered 2019-06-28: 60 ug/min via INTRAVENOUS

## 2019-06-28 MED ORDER — PRENATAL MULTIVITAMIN CH
1.0000 | ORAL_TABLET | Freq: Every day | ORAL | Status: DC
  Administered 2019-06-28 – 2019-06-30 (×3): 1 via ORAL
  Filled 2019-06-28 (×3): qty 1

## 2019-06-28 MED ORDER — DEXAMETHASONE SODIUM PHOSPHATE 10 MG/ML IJ SOLN
INTRAMUSCULAR | Status: AC
  Filled 2019-06-28: qty 1

## 2019-06-28 MED ORDER — KETOROLAC TROMETHAMINE 30 MG/ML IJ SOLN
INTRAMUSCULAR | Status: AC
  Filled 2019-06-28: qty 1

## 2019-06-28 MED ORDER — HYDROMORPHONE HCL 1 MG/ML IJ SOLN: 0.2500 mg | INTRAMUSCULAR | Status: DC | PRN

## 2019-06-28 MED ORDER — CEFAZOLIN SODIUM-DEXTROSE 2-4 GM/100ML-% IV SOLN
INTRAVENOUS | Status: AC
  Filled 2019-06-28: qty 100

## 2019-06-28 MED ORDER — MEPERIDINE HCL 25 MG/ML IJ SOLN: 6.2500 mg | INTRAMUSCULAR | Status: DC | PRN

## 2019-06-28 MED ORDER — OXYCODONE HCL 5 MG PO TABS: 5.0000 mg | ORAL_TABLET | Freq: Once | ORAL | Status: DC | PRN

## 2019-06-28 MED ORDER — MESALAMINE 1.2 G PO TBEC
4.8000 g | DELAYED_RELEASE_TABLET | Freq: Every day | ORAL | Status: DC
  Filled 2019-06-28 (×3): qty 4

## 2019-06-28 MED ORDER — MAGNESIUM HYDROXIDE 400 MG/5ML PO SUSP: 30.0000 mL | ORAL | Status: DC | PRN

## 2019-06-28 MED ORDER — DIBUCAINE (PERIANAL) 1 % EX OINT: 1.0000 "application " | TOPICAL_OINTMENT | CUTANEOUS | Status: DC | PRN

## 2019-06-28 MED ORDER — BUPIVACAINE IN DEXTROSE 0.75-8.25 % IT SOLN
INTRATHECAL | Status: DC | PRN
  Administered 2019-06-28: 1.6 mL via INTRATHECAL

## 2019-06-28 MED ORDER — LACTATED RINGERS IV SOLN: INTRAVENOUS | Status: DC | PRN

## 2019-06-28 MED ORDER — NALBUPHINE HCL 10 MG/ML IJ SOLN
INTRAMUSCULAR | Status: AC
  Filled 2019-06-28: qty 1

## 2019-06-28 MED ORDER — MORPHINE SULFATE (PF) 0.5 MG/ML IJ SOLN
INTRAMUSCULAR | Status: DC | PRN
  Administered 2019-06-28: 150 ug via INTRATHECAL

## 2019-06-28 MED ORDER — SODIUM CHLORIDE 0.9% FLUSH: 3.0000 mL | INTRAVENOUS | Status: DC | PRN

## 2019-06-28 MED ORDER — PROMETHAZINE HCL 25 MG/ML IJ SOLN: 6.2500 mg | INTRAMUSCULAR | Status: DC | PRN

## 2019-06-28 MED ORDER — METHYLERGONOVINE MALEATE 0.2 MG PO TABS: 0.2000 mg | ORAL_TABLET | ORAL | Status: DC | PRN

## 2019-06-28 MED ORDER — ZOLPIDEM TARTRATE 5 MG PO TABS: 5.0000 mg | ORAL_TABLET | Freq: Every evening | ORAL | Status: DC | PRN

## 2019-06-28 MED ORDER — OXYCODONE HCL 5 MG PO TABS
5.0000 mg | ORAL_TABLET | ORAL | Status: DC | PRN
  Administered 2019-06-29 (×4): 5 mg via ORAL
  Administered 2019-06-30 (×2): 10 mg via ORAL
  Filled 2019-06-28 (×2): qty 1
  Filled 2019-06-28: qty 2
  Filled 2019-06-28 (×2): qty 1
  Filled 2019-06-28: qty 2

## 2019-06-28 MED ORDER — CEFAZOLIN SODIUM-DEXTROSE 2-4 GM/100ML-% IV SOLN
2.0000 g | INTRAVENOUS | Status: AC
  Administered 2019-06-28: 2 g via INTRAVENOUS

## 2019-06-28 MED ORDER — SCOPOLAMINE 1 MG/3DAYS TD PT72
1.0000 | MEDICATED_PATCH | Freq: Once | TRANSDERMAL | Status: DC
  Administered 2019-06-28: 1.5 mg via TRANSDERMAL

## 2019-06-28 MED ORDER — NALBUPHINE HCL 10 MG/ML IJ SOLN
5.0000 mg | Freq: Once | INTRAMUSCULAR | Status: AC | PRN
  Administered 2019-06-28: 5 mg via INTRAVENOUS

## 2019-06-28 MED ORDER — PHENYLEPHRINE HCL-NACL 20-0.9 MG/250ML-% IV SOLN
INTRAVENOUS | Status: AC
  Filled 2019-06-28: qty 250

## 2019-06-28 MED ORDER — MORPHINE SULFATE (PF) 0.5 MG/ML IJ SOLN
INTRAMUSCULAR | Status: AC
  Filled 2019-06-28: qty 10

## 2019-06-28 MED ORDER — ONDANSETRON HCL 4 MG/2ML IJ SOLN
INTRAMUSCULAR | Status: AC
  Filled 2019-06-28: qty 2

## 2019-06-28 SURGICAL SUPPLY — 31 items
BENZOIN TINCTURE PRP APPL 2/3 (GAUZE/BANDAGES/DRESSINGS) ×2 IMPLANT
CHLORAPREP W/TINT 26ML (MISCELLANEOUS) ×2 IMPLANT
CLAMP CORD UMBIL (MISCELLANEOUS) IMPLANT
CLOTH BEACON ORANGE TIMEOUT ST (SAFETY) ×2 IMPLANT
CLSR STERI-STRIP ANTIMIC 1/2X4 (GAUZE/BANDAGES/DRESSINGS) ×2 IMPLANT
DRSG OPSITE POSTOP 4X10 (GAUZE/BANDAGES/DRESSINGS) ×2 IMPLANT
ELECT REM PT RETURN 9FT ADLT (ELECTROSURGICAL) ×2
ELECTRODE REM PT RTRN 9FT ADLT (ELECTROSURGICAL) ×1 IMPLANT
EXTRACTOR VACUUM KIWI (MISCELLANEOUS) IMPLANT
EXTRACTOR VACUUM M CUP 4 TUBE (SUCTIONS) IMPLANT
GLOVE BIOGEL PI IND STRL 7.0 (GLOVE) ×1 IMPLANT
GLOVE BIOGEL PI INDICATOR 7.0 (GLOVE) ×1
GLOVE ORTHO TXT STRL SZ7.5 (GLOVE) ×2 IMPLANT
GOWN STRL REUS W/TWL LRG LVL3 (GOWN DISPOSABLE) ×4 IMPLANT
KIT ABG SYR 3ML LUER SLIP (SYRINGE) IMPLANT
NEEDLE HYPO 25X5/8 SAFETYGLIDE (NEEDLE) ×2 IMPLANT
NS IRRIG 1000ML POUR BTL (IV SOLUTION) ×2 IMPLANT
PACK C SECTION WH (CUSTOM PROCEDURE TRAY) ×2 IMPLANT
PAD OB MATERNITY 4.3X12.25 (PERSONAL CARE ITEMS) ×2 IMPLANT
PENCIL SMOKE EVAC W/HOLSTER (ELECTROSURGICAL) ×2 IMPLANT
RTRCTR C-SECT PINK 25CM LRG (MISCELLANEOUS) ×2 IMPLANT
SUT CHROMIC 1 CTX 36 (SUTURE) ×4 IMPLANT
SUT PLAIN 0 NONE (SUTURE) IMPLANT
SUT PLAIN 2 0 XLH (SUTURE) IMPLANT
SUT VIC AB 0 CT1 27 (SUTURE) ×2
SUT VIC AB 0 CT1 27XBRD ANBCTR (SUTURE) ×2 IMPLANT
SUT VIC AB 2-0 CT1 (SUTURE) ×2 IMPLANT
SUT VIC AB 4-0 KS 27 (SUTURE) IMPLANT
TOWEL OR 17X24 6PK STRL BLUE (TOWEL DISPOSABLE) ×2 IMPLANT
TRAY FOLEY W/BAG SLVR 14FR LF (SET/KITS/TRAYS/PACK) ×2 IMPLANT
WATER STERILE IRR 1000ML POUR (IV SOLUTION) ×2 IMPLANT

## 2019-06-28 NOTE — Anesthesia Postprocedure Evaluation (Signed)
Anesthesia Post Note  Patient: Laura Vaughn  Procedure(s) Performed: CESAREAN SECTION (N/A )     Patient location during evaluation: PACU Anesthesia Type: Spinal Level of consciousness: awake and alert Pain management: pain level controlled Vital Signs Assessment: post-procedure vital signs reviewed and stable Respiratory status: spontaneous breathing, nonlabored ventilation and respiratory function stable Cardiovascular status: blood pressure returned to baseline and stable Postop Assessment: no apparent nausea or vomiting Anesthetic complications: no   No complications documented.  Last Vitals:  Vitals:   06/28/19 0915 06/28/19 0930  BP: 91/70 103/69  Pulse: 60 68  Resp: 16 13  Temp:    SpO2: 97% 99%    Last Pain:  Vitals:   06/28/19 0900  TempSrc:   PainSc: 0-No pain   Pain Goal:    LLE Motor Response: Purposeful movement (06/28/19 0900) LLE Sensation: Tingling (06/28/19 0900) RLE Motor Response: Purposeful movement (06/28/19 0900) RLE Sensation: Tingling (06/28/19 0900)     Epidural/Spinal Function Cutaneous sensation: Tingles (06/28/19 0900), Patient able to flex knees: No (06/28/19 0900), Patient able to lift hips off bed: No (06/28/19 0900), Back pain beyond tenderness at insertion site: No (06/28/19 0900), Progressively worsening motor and/or sensory loss: No (06/28/19 0900), Bowel and/or bladder incontinence post epidural: No (06/28/19 0900)  Lynda Rainwater

## 2019-06-28 NOTE — Interval H&P Note (Signed)
History and Physical Interval Note:  06/28/2019 7:13 AM  Laura Vaughn  has presented today for surgery, with the diagnosis of primary c-section for ulcerative colitis.  The various methods of treatment have been discussed with the patient and family. After consideration of risks, benefits and other options for treatment, the patient has consented to  Procedure(s) with comments: CESAREAN SECTION (N/A) - request RNFA as a surgical intervention.  The patient's history has been reviewed, patient examined, no change in status, stable for surgery.  I have reviewed the patient's chart and labs.  Questions were answered to the patient's satisfaction.     Blane Ohara [name]

## 2019-06-28 NOTE — Op Note (Signed)
Preoperative diagnosis: Intrauterine pregnancy at 39 weeks, previous significant perineal trauma Postoperative diagnosis: Same Procedure: Primary low transverse cesarean section without extensions Surgeon: Cheri Fowler M.D. Anesthesia: Spinal  Findings: Patient had normal gravid anatomy and delivered a viable female infant with Apgars of 8 and 10 weight pending Estimated blood loss: 500 cc Specimens: Placenta sent to labor and delivery Complications: None  Procedure in detail: The patient was taken to the operating room and placed in the sitting position. The anesthesiologist instilled spinal anesthesia.  She was then placed in the dorsosupine position with left tilt. Abdomen was then prepped and draped in the usual sterile fashion, and a foley catheter was inserted. The level of her anesthesia was found to be adequate. Abdomen was entered via a standard Pfannenstiel incision. Once the peritoneal cavity was entered the Alexis disposable self-retaining retractor was placed and good visualization was achieved. A 4 cm transverse incision was then made in the lower uterine segment pushing the bladder inferior. Once the uterine cavity was entered the incision was extended digitally, clear amniotic fluid. The fetal vertex was grasped and delivered through the incision atraumatically. Mouth and nares were suctioned. The remainder of the infant then delivered atraumatically. Cord was doubly clamped and cut after one minute and the infant handed to the awaiting pediatric team. Cord blood was obtained. The placenta delivered spontaneously. Uterus was wiped dry with clean lap pad and all clots and debris were removed. Uterine incision was inspected and found to be free of extensions. Uterine incision was closed in 1 layer with running locking #1 Chromic. Tubes and ovaries were inspected and found to be normal. Uterine incision was inspected and found to be hemostatic. Bleeding from serosal edges was controlled  with electrocautery. The Alexis retractor was removed. Subfascial space was irrigated and made hemostatic with electrocautery. Peritoneum was closed with 2-0 Vicryl.  Fascia was closed in running fashion starting at both ends and meeting in the middle with 0 Vicryl. Subcutaneous tissue was then irrigated and made hemostatic with electrocautery. Skin was closed with running 4-0 Vicryl subcuticular suture followed by steri-strips and a sterile dressing. Patient tolerated the procedure well and was taken to the recovery in stable condition. Counts were correct x2, she received Ancef 2 g IV at the beginning of the procedure and she had PAS hose on throughout the procedure.

## 2019-06-28 NOTE — Anesthesia Procedure Notes (Addendum)
Spinal  Patient location during procedure: OB Start time: 06/28/2019 7:18 AM End time: 06/28/2019 7:23 AM Staffing Performed: anesthesiologist  Anesthesiologist: Lynda Rainwater, MD Preanesthetic Checklist Completed: patient identified, IV checked, risks and benefits discussed, surgical consent, monitors and equipment checked, pre-op evaluation and timeout performed Spinal Block Patient position: sitting Prep: DuraPrep and site prepped and draped Patient monitoring: heart rate, cardiac monitor, continuous pulse ox and blood pressure Approach: midline Location: L3-4 Injection technique: single-shot Needle Needle type: Pencan  Needle gauge: 24 G Needle length: 10 cm Assessment Sensory level: T4

## 2019-06-28 NOTE — Anesthesia Preprocedure Evaluation (Signed)
Anesthesia Evaluation  Patient identified by MRN, date of birth, ID band Patient awake    Reviewed: Allergy & Precautions, NPO status , Patient's Chart, lab work & pertinent test results  Airway Mallampati: II  TM Distance: >3 FB Neck ROM: Full    Dental no notable dental hx.    Pulmonary neg pulmonary ROS,    Pulmonary exam normal breath sounds clear to auscultation       Cardiovascular negative cardio ROS Normal cardiovascular exam Rhythm:Regular Rate:Normal     Neuro/Psych negative neurological ROS  negative psych ROS   GI/Hepatic negative GI ROS, Neg liver ROS,   Endo/Other  negative endocrine ROS  Renal/GU negative Renal ROS  negative genitourinary   Musculoskeletal negative musculoskeletal ROS (+)   Abdominal   Peds negative pediatric ROS (+)  Hematology negative hematology ROS (+)   Anesthesia Other Findings Ulcerative Colitis  Reproductive/Obstetrics (+) Pregnancy                             Anesthesia Physical Anesthesia Plan  ASA: III  Anesthesia Plan: Spinal   Post-op Pain Management:    Induction:   PONV Risk Score and Plan: 2 and Ondansetron, Midazolam and Treatment may vary due to age or medical condition  Airway Management Planned: Natural Airway  Additional Equipment:   Intra-op Plan:   Post-operative Plan:   Informed Consent: I have reviewed the patients History and Physical, chart, labs and discussed the procedure including the risks, benefits and alternatives for the proposed anesthesia with the patient or authorized representative who has indicated his/her understanding and acceptance.     Dental advisory given  Plan Discussed with: CRNA  Anesthesia Plan Comments:         Anesthesia Quick Evaluation

## 2019-06-28 NOTE — Transfer of Care (Signed)
Immediate Anesthesia Transfer of Care Note  Patient: Laura Vaughn  Procedure(s) Performed: CESAREAN SECTION (N/A )  Patient Location: PACU  Anesthesia Type:Spinal  Level of Consciousness: awake and alert   Airway & Oxygen Therapy: Patient Spontanous Breathing  Post-op Assessment: Report given to RN  Post vital signs: Reviewed  Last Vitals:  Vitals Value Taken Time  BP    Temp    Pulse    Resp    SpO2      Last Pain:  Vitals:   06/28/19 0542  TempSrc:   PainSc: 0-No pain         Complications: No complications documented.

## 2019-06-28 NOTE — Lactation Note (Signed)
This note was copied from a baby's chart. Lactation Consultation Note  Patient Name: Laura Vaughn [id number] Date: 06/28/2019  Mom is a G2p2 csection delivery of baby Laura Laura Vaughn now 58 hours old.   Mom reports she  did not breastfeed with her first baby because she got a severe infection past delivery and was in the hospital for about 1 month. Mom reports she feels Laura Vaughn is breastfeeding well and has already breastfed three times since delivery.  Mom and Laura Vaughn STS at this time.  Reviewed Understanding Mother and Baby.  Reviewed and gave [facility] Breastfeeding consultation Services handout. Parents deny need for breastfeeding services at this time. Praised decision to breastfeed.  Urged mom to call lactation as needed.    Maternal Data    Feeding Feeding Type: Breast Fed  LATCH Score Latch:  (encouraged mom to call out for latch assessment )                 Interventions    Lactation Tools Discussed/Used     Consult Status      Trek Kimball Thompson Caul 06/28/2019, 3:20 PM

## 2019-06-29 LAB — CBC
HCT: 28.4 % — ABNORMAL LOW (ref 36.0–46.0)
Hemoglobin: 8.9 g/dL — ABNORMAL LOW (ref 12.0–15.0)
MCH: 26.6 pg (ref 26.0–34.0)
MCHC: 31.3 g/dL (ref 30.0–36.0)
MCV: 84.8 fL (ref 80.0–100.0)
Platelets: 378 10*3/uL (ref 150–400)
RBC: 3.35 MIL/uL — ABNORMAL LOW (ref 3.87–5.11)
RDW: 15.5 % (ref 11.5–15.5)
WBC: 17.1 10*3/uL — ABNORMAL HIGH (ref 4.0–10.5)
nRBC: 0.1 % (ref 0.0–0.2)

## 2019-06-29 LAB — BIRTH TISSUE RECOVERY COLLECTION (PLACENTA DONATION)

## 2019-06-29 MED ORDER — FERROUS SULFATE 325 (65 FE) MG PO TABS
325.0000 mg | ORAL_TABLET | Freq: Every day | ORAL | Status: DC
  Administered 2019-06-30: 325 mg via ORAL
  Filled 2019-06-29: qty 1

## 2019-06-29 NOTE — Lactation Note (Signed)
This note was copied from a baby's chart. Lactation Consultation Note  Patient Name: Laura Vaughn [id number] Date: 06/29/2019 Reason for consult: Follow-up assessment;1st time breastfeeding;Term Baby is 27 hours old/6% weight loss.  Mom reports that baby is breastfeeding well.  Discussed cluster feeding on days 2-3.  Reviewed feeding cues and instructed to call for assist prn.  Baby is currently sleeping in FOB's arms.  Mom asking if she should pump in addition to latching baby to breast. Explained that pumping would not be needed as long as baby feeds at least 8 times per day.  Maternal Data    Feeding    LATCH Score                   Interventions    Lactation Tools Discussed/Used     Consult Status Consult Status: Follow-up Date: 06/30/19 Follow-up type: In-patient    Ave Filter 06/29/2019, 11:01 AM

## 2019-06-29 NOTE — Lactation Note (Signed)
This note was copied from a baby's chart. Lactation Consultation Note  Patient Name: Laura Vaughn [id number] Date: 06/29/2019 Reason for consult: Follow-up assessment;Difficult latch;Nipple pain/trauma;Infant weight loss;Term  RN requested LC to assess and assist with latch as Mom has been experiencing pain.  P2 Mom of term baby at 71 hrs old.  Baby at 6% weight loss with good output.    Mom holding baby STS on her chest.  Mom concerned that baby will latch, but only suck a few times before coming off the breast.  Offered to assist.  Baby taken off STS and baby immediately started cueing to eat.    Assisted Mom with cross cradle hold.  Mom has large breasts, with erect nipples and compressible areola.  Areola noted to have some bruising from earlier latches, both nipples have positional stripes on them.    Baby's tongue noted to be cupped in mouth when she was crying.  Unable to identify a tight frenulum, but on finger, baby pushing with her tongue against finger.    Mom using breast support with a U hold, pillow support under baby.  Baby opens and latches, but quickly pushes back after a couple sucks.  Showed FOB how to tug on chin to open mouth and flange lower lip. Tried multiple times to relatch, nipple pinched.  Switched to football hold, and baby continued to do the same thing.  Fussy, tense, latches and then loses the depth on the breast.    Initiated a 24 mm nipple shield, showing Mom how to properly apply.  Nipple pulls well into shield.  Baby able to latch for a more comfortable latch, Mom taught to firmly support her breast during the feeding.  Baby still at times pushing nipple out of her mouth.  Baby did become rhythmic and swallowing identified.  Baby came off after 20 mins appearing contented.  Had Mom return demo applying nipple shield.  Set up DEBP and assisted with first pumping on initiation setting.  27 mm flange on right breast and 24 mm flange on left.  Mom expressing  colostrum.  Mom to ask her RN for assistance with colostrum.  Suggested spoon Maternal Data    Feeding Feeding Type: Breast Fed  LATCH Score Latch: Grasps breast easily, tongue down, lips flanged, rhythmical sucking.  Audible Swallowing: Spontaneous and intermittent  Type of Nipple: Everted at rest and after stimulation  Comfort (Breast/Nipple): Filling, red/small blisters or bruises, mild/mod discomfort  Hold (Positioning): Assistance needed to correctly position infant at breast and maintain latch.  LATCH Score: 8  Interventions Interventions: Breast feeding basics reviewed;Assisted with latch;Skin to skin;Breast massage;Hand express;Breast compression;Adjust position;Support pillows;Position options;Expressed milk;DEBP  Lactation Tools Discussed/Used Tools: Nipple Shields;Pump;Flanges Nipple shield size: 24 Flange Size: 24;27 Breast pump type: Double-Electric Breast Pump WIC Program: No Pump Review: Setup, frequency, and cleaning;Milk Storage Initiated by:: Cipriano Mile RN IBCLC Date initiated:: 06/29/19   Consult Status Consult Status: Follow-up Date: 06/30/19 Follow-up type: In-patient    Laura Vaughn 06/29/2019, 6:51 PM

## 2019-06-29 NOTE — Progress Notes (Signed)
POSTPARTUM POSTOP PROGRESS NOTE  POD #1  Subjective:  No acute events overnight.  Pt denies problems with ambulating, voiding or po intake.  She denies nausea or vomiting.  Pain is well controlled.  She has not had flatus. She has not had bowel movement.  Lochia Minimal. Stopped using Lialda (mesalamine) as she found it ineffective.   Objective: Blood pressure 102/60, pulse 63, temperature 97.9 F (36.6 C), temperature source Oral, resp. rate 20, height 5' 8"  (1.727 m), weight 92.4 kg, SpO2 98 %, unknown if currently breastfeeding.  Physical Exam:  General: alert, cooperative and no distress Lochia:normal flow Chest: CTAB Heart: RRR no m/r/g Abdomen: +BS, soft, nontender Uterine Fundus: firm, 2cm below umbilicus. Honeycomb dressing intact, minimal serosanguinous drainage Extremities: neg edema, neg calf TTP BL, neg Homans BL  Recent Labs    06/27/19 0813 06/29/19 0437  HGB 11.1* 8.9*  HCT 35.6* 28.4*    Assessment/Plan:  ASSESSMENT: Marchia Diguglielmo is a 24 y.o. [id number] s/p PLTCS @ 35w0dfor h/o perineal trauma. PNC c/b ulcerative colitis, partial vulvo-vaginectomy for vaginal nec fasc w/ subsequent diverting colostomy (replaced).   Plan for discharge tomorrow and Breastfeeding  Anemia 2/2 acute blood loss - PO iron/colace   LOS: 1 day

## 2019-06-30 ENCOUNTER — Ambulatory Visit: Payer: Self-pay

## 2019-06-30 MED ORDER — IBUPROFEN 800 MG PO TABS: 800.0000 mg | ORAL_TABLET | Freq: Three times a day (TID) | ORAL | 1 refills | Status: DC | PRN

## 2019-06-30 MED ORDER — FERROUS SULFATE 325 (65 FE) MG PO TABS: 325.0000 mg | ORAL_TABLET | Freq: Every day | ORAL | 1 refills | Status: DC

## 2019-06-30 MED ORDER — DOCUSATE SODIUM 100 MG PO CAPS
100.0000 mg | ORAL_CAPSULE | Freq: Two times a day (BID) | ORAL | 1 refills | Status: DC | PRN
Start: 2019-06-30 — End: 2020-03-05

## 2019-06-30 MED ORDER — OXYCODONE HCL 5 MG PO TABS: 5.0000 mg | ORAL_TABLET | Freq: Four times a day (QID) | ORAL | 0 refills | Status: DC | PRN

## 2019-06-30 NOTE — Progress Notes (Signed)
Oxy given due to previous need for pain control.

## 2019-06-30 NOTE — Lactation Note (Signed)
This note was copied from a baby's chart. Lactation Consultation Note  Patient Name: Girl Joannie Medine Today's Date: 06/30/2019   Mother reports that she feels that breastfeeding is going well.  Mother reports that she is pumping after breastfeeding. Father reports that mother pumped 11  Ml the first time that she pumped and that she has continued to obtain milk when each pumping. Mother advised to protect her milk supply. Mother to hand express. She denies being sore . Mother has no questions or concerns   discussed treatment and prevention of engorgement.   Mother to continue to cue base feed infant and feed at least 8-12 times or more in 24 hours and advised to allow for cluster feeding infant as needed.   Mother to continue to due STS. Mother is aware of available LC services at [facility], [facility]S, OP Dept, and phone # for questions or concerns about breastfeeding.  Mother receptive to all teaching and plan of care.        Maternal Data    Feeding Feeding Type: Breast Fed  LATCH Score                   Interventions    Lactation Tools Discussed/Used Tools: Nipple [place] [employer]   Consult Status      Jess Barters [facility] 06/30/2019, 3:23 PM

## 2019-06-30 NOTE — Discharge Summary (Signed)
   Postpartum Discharge Summary  Date of Service updated     Patient Name: Laura Vaughn DOB: 01/02/1996 MRN: [id number]  Date of admission: 06/28/2019 Delivery date:06/28/2019  Delivering provider: MEISINGER, TODD  Date of discharge: 06/30/2019  Admitting diagnosis: S/P cesarean section [Z98.891] Indication for care in labor or delivery [O75.9] Intrauterine pregnancy: [redacted]w[redacted]d     Secondary diagnosis:  Active Problems:   S/P cesarean section   Indication for care in labor or delivery  Additional problems: H/o vaginal/vulvar necrotizing fasciitis postpartum requiring partial vulvovaginectomy with skin graft, diverting colostomy with eventual reversal    Discharge diagnosis: Term Pregnancy Delivered                                              Post partum procedures:none Complications: None  Hospital course: Sceduled C/S   24 y.o. yo G2P2002 at [redacted]w[redacted]d was admitted to the hospital 06/28/2019 for scheduled cesarean section with the following indication:H/o vaginal/vulvar necrotizing fasciitis postpartum requiring partial vulvovaginectomy with skin graft, diverting colostomy with eventual reversal.Delivery details are as follows:  Membrane Rupture Time/Date: 7:44 AM ,06/28/2019   Delivery Method:C-Section, Low Transverse  Details of operation can be found in separate operative note.  Patient had an uncomplicated postpartum course.  She is ambulating, tolerating a regular diet, passing flatus, and urinating well. Patient is discharged home in stable condition on  06/30/19        Newborn Data: Birth date:06/28/2019  Birth time:7:45 AM  Gender:Female  Living status:Living  Apgars:8 ,10  Weight:3480 g     Magnesium Sulfate received: No BMZ received: No Rhophylac:No MMR:No T-DaP:Given prenatally Flu: Declined in pregnancy Transfusion:No  Physical exam  Vitals:   06/29/19 0859 06/29/19 1307 06/29/19 2146 06/30/19 0623  BP: 102/60 105/68 111/61 105/66  Pulse: 63 61 72 63  Resp: 20 18  17 18  Temp: 97.9 F (36.6 C) 98.5 F (36.9 C) 98.2 F (36.8 C) 98 F (36.7 C)  TempSrc: Oral Oral Oral Oral  SpO2: 98% 100% 99% 99%  Weight:      Height:       General: alert, cooperative and no distress Lochia: appropriate Uterine Fundus: firm Incision: Healing well with no significant drainage, No significant erythema, Dressing is clean, dry, and intact DVT Evaluation: No evidence of DVT seen on physical exam. Negative Homan's sign. No cords or calf tenderness. Labs: Lab Results  Component Value Date   WBC 17.1 (H) 06/29/2019   HGB 8.9 (L) 06/29/2019   HCT 28.4 (L) 06/29/2019   MCV 84.8 06/29/2019   PLT 378 06/29/2019   CMP Latest Ref Rng & Units 05/23/2019  Glucose 70 - 99 mg/dL 84  BUN 6 - 20 mg/dL 6  Creatinine 0.44 - 1.00 mg/dL 0.68  Sodium 135 - 145 mmol/L 135  Potassium 3.5 - 5.1 mmol/L 3.8  Chloride 98 - 111 mmol/L 107  CO2 22 - 32 mmol/L 21(L)  Calcium 8.9 - 10.3 mg/dL 8.0(L)  Total Protein 6.5 - 8.1 g/dL 6.6  Total Bilirubin 0.3 - 1.2 mg/dL 0.5  Alkaline Phos 38 - 126 U/L 112  AST 15 - 41 U/L 23  ALT 0 - 44 U/L 15   Edinburgh Score: Edinburgh Postnatal Depression Scale Screening Tool 06/29/2019  I have been able to laugh and see the funny side of things. 0  I have looked forward with enjoyment to things.   0  I have blamed myself unnecessarily when things went wrong. 1  I have been anxious or worried for no good reason. 1  I have felt scared or panicky for no good reason. 1  Things have been getting on top of me. 0  I have been so unhappy that I have had difficulty sleeping. 0  I have felt sad or miserable. 1  I have been so unhappy that I have been crying. 1  The thought of harming myself has occurred to me. 0  Edinburgh Postnatal Depression Scale Total 5      After visit meds:  Allergies as of 06/30/2019   No Known Allergies     Medication List    STOP taking these medications   acetaminophen 325 MG tablet Commonly known as: TYLENOL    mesalamine 1.2 g EC tablet Commonly known as: LIALDA     TAKE these medications   diphenoxylate-atropine 2.5-0.025 MG tablet Commonly known as: Lomotil Take 1 tablet by mouth 4 (four) times daily as needed for diarrhea or loose stools.   docusate sodium 100 MG capsule Commonly known as: Colace Take 1 capsule (100 mg total) by mouth 2 (two) times daily as needed for mild constipation.   ferrous sulfate 325 (65 FE) MG tablet Take 1 tablet (325 mg total) by mouth daily with breakfast. Start taking on: July 01, 2019   ibuprofen 800 MG tablet Commonly known as: ADVIL Take 1 tablet (800 mg total) by mouth every 8 (eight) hours as needed.   oxyCODONE 5 MG immediate release tablet Commonly known as: Oxy IR/ROXICODONE Take 1 tablet (5 mg total) by mouth every 6 (six) hours as needed for moderate pain or severe pain.   PRENATAL PO Take 1 tablet by mouth daily.            Discharge Care Instructions  (From admission, onward)         Start     Ordered   06/30/19 0000  Leave dressing on - Keep it clean, dry, and intact until clinic visit        06/30/19 1102           Discharge home in stable condition Infant Feeding: Breast Infant Disposition:home with mother Discharge instruction: per After Visit Summary and Postpartum booklet. Activity: Advance as tolerated. Pelvic rest for 6 weeks.  Diet: routine diet Anticipated Birth Control: Unsure Postpartum Appointment:6 weeks Additional Postpartum F/U: Incision check 2wk Future Appointments:No future appointments. Follow up Visit:      06/30/2019 Deliah Boston, MD

## 2019-06-30 NOTE — Progress Notes (Signed)
POSTPARTUM POSTOP PROGRESS NOTE  POD #2  Subjective:  No acute events overnight.  Pt denies problems with ambulating, voiding or po intake.  She denies nausea or vomiting.  Pain is well controlled.  She has had flatus. She has had bowel movement.  Lochia Minimal. Breast feeding improved. Can tolerate PO ibuprofen without issue  Objective: Blood pressure 105/66, pulse 63, temperature 98 F (36.7 C), temperature source Oral, resp. rate 18, height 5' 8"  (1.727 m), weight 92.4 kg, SpO2 99 %, unknown if currently breastfeeding.  Physical Exam:  General: alert, cooperative and no distress Lochia:normal flow Chest: CTAB Heart: RRR no m/r/g Abdomen: +BS, soft, nontender Uterine Fundus: firm, 2cm below umbilicus. Honeycomb dressing intact, minimal serosanguinous drainage Extremities: neg edema, neg calf TTP BL, neg Homans BL  Recent Labs    06/29/19 0437  HGB 8.9*  HCT 28.4*    Assessment/Plan:  ASSESSMENT: Laura Vaughn is a 24 y.o. G3O7564 s/p PLTCS @ 87w0dfor h/o perineal trauma. PNC c/b ulcerative colitis, partial vulvo-vaginectomy for vaginal nec fasc w/ subsequent diverting colostomy (replaced).   Discharge home and Breastfeeding  Anemia 2/2 acute blood loss - PO iron/colace   LOS: 2 days

## 2019-10-15 ENCOUNTER — Emergency Department (HOSPITAL_COMMUNITY)
Admission: EM | Admit: 2019-10-15 | Discharge: 2019-10-15 | Discharge status: Absent without leave | Payer: Commercial Managed Care - PPO

## 2019-10-15 ENCOUNTER — Other Ambulatory Visit: Payer: Self-pay

## 2020-03-05 ENCOUNTER — Ambulatory Visit
Admission: EM | Admit: 2020-03-05 | Discharge: 2020-03-05 | Disposition: A | Discharge status: Home / Self Care | Payer: Commercial Managed Care - PPO

## 2020-03-05 ENCOUNTER — Emergency Department (HOSPITAL_COMMUNITY): Payer: Commercial Managed Care - PPO

## 2020-03-05 ENCOUNTER — Emergency Department (HOSPITAL_COMMUNITY)
Admission: EM | Admit: 2020-03-05 | Discharge: 2020-03-05 | Disposition: A | Discharge status: Home / Self Care | Payer: Commercial Managed Care - PPO | Source: Home / Self Care | Attending: Emergency Medicine | Admitting: Emergency Medicine

## 2020-03-05 ENCOUNTER — Other Ambulatory Visit: Payer: Self-pay

## 2020-03-05 ENCOUNTER — Encounter (HOSPITAL_COMMUNITY): Payer: Self-pay

## 2020-03-05 DIAGNOSIS — Z79899 Other long term (current) drug therapy: Secondary | ICD-10-CM | POA: Insufficient documentation

## 2020-03-05 DIAGNOSIS — A419 Sepsis, unspecified organism: Secondary | ICD-10-CM | POA: Diagnosis not present

## 2020-03-05 DIAGNOSIS — K5100 Ulcerative (chronic) pancolitis without complications: Secondary | ICD-10-CM

## 2020-03-05 DIAGNOSIS — M791 Myalgia, unspecified site: Secondary | ICD-10-CM | POA: Insufficient documentation

## 2020-03-05 DIAGNOSIS — Z8616 Personal history of COVID-19: Secondary | ICD-10-CM | POA: Insufficient documentation

## 2020-03-05 DIAGNOSIS — R110 Nausea: Secondary | ICD-10-CM | POA: Insufficient documentation

## 2020-03-05 DIAGNOSIS — R109 Unspecified abdominal pain: Secondary | ICD-10-CM | POA: Insufficient documentation

## 2020-03-05 DIAGNOSIS — Z20822 Contact with and (suspected) exposure to covid-19: Secondary | ICD-10-CM

## 2020-03-05 DIAGNOSIS — M545 Low back pain, unspecified: Secondary | ICD-10-CM | POA: Insufficient documentation

## 2020-03-05 LAB — CBC
HCT: 38.6 % (ref 36.0–46.0)
Hemoglobin: 12.4 g/dL (ref 12.0–15.0)
MCH: 28.2 pg (ref 26.0–34.0)
MCHC: 32.1 g/dL (ref 30.0–36.0)
MCV: 87.9 fL (ref 80.0–100.0)
Platelets: 426 10*3/uL — ABNORMAL HIGH (ref 150–400)
RBC: 4.39 MIL/uL (ref 3.87–5.11)
RDW: 14.5 % (ref 11.5–15.5)
WBC: 9.3 10*3/uL (ref 4.0–10.5)
nRBC: 0 % (ref 0.0–0.2)

## 2020-03-05 LAB — HEPATIC FUNCTION PANEL
ALT: 32 U/L (ref 0–44)
AST: 27 U/L (ref 15–41)
Albumin: 4.2 g/dL (ref 3.5–5.0)
Alkaline Phosphatase: 91 U/L (ref 38–126)
Bilirubin, Direct: <0.1 mg/dL (ref 0.0–0.2)
Total Bilirubin: 0.6 mg/dL (ref 0.3–1.2)
Total Protein: 7.7 g/dL (ref 6.5–8.1)

## 2020-03-05 LAB — BASIC METABOLIC PANEL
Anion gap: 8 (ref 5–15)
BUN: 8 mg/dL (ref 6–20)
CO2: 23 mmol/L (ref 22–32)
Calcium: 9.2 mg/dL (ref 8.9–10.3)
Chloride: 103 mmol/L (ref 98–111)
Creatinine, Ser: 0.88 mg/dL (ref 0.44–1.00)
GFR, Estimated: >60 mL/min (ref >60)
Glucose, Bld: 97 mg/dL (ref 70–99)
Potassium: 3.9 mmol/L (ref 3.5–5.1)
Sodium: 134 mmol/L — ABNORMAL LOW (ref 135–145)

## 2020-03-05 LAB — URINALYSIS, ROUTINE W REFLEX MICROSCOPIC
Bilirubin Urine: NEGATIVE
Glucose, UA: NEGATIVE mg/dL
Hgb urine dipstick: NEGATIVE
Ketones, ur: 80 mg/dL — AB
Leukocytes,Ua: NEGATIVE
Nitrite: NEGATIVE
Protein, ur: NEGATIVE mg/dL
Specific Gravity, Urine: 1.015 (ref 1.005–1.030)
pH: 6 (ref 5.0–8.0)

## 2020-03-05 LAB — CBG MONITORING, ED: Glucose-Capillary: 86 mg/dL (ref 70–99)

## 2020-03-05 LAB — POC SARS CORONAVIRUS 2 AG -  ED: SARS Coronavirus 2 Ag: NEGATIVE

## 2020-03-05 LAB — POC URINE PREG, ED: Preg Test, Ur: NEGATIVE

## 2020-03-05 MED ORDER — ACETAMINOPHEN 325 MG PO TABS
650.0000 mg | ORAL_TABLET | Freq: Once | ORAL | Status: AC
  Administered 2020-03-05: 650 mg via ORAL
  Filled 2020-03-05: qty 2

## 2020-03-05 MED ORDER — IOHEXOL 300 MG/ML  SOLN
100.0000 mL | Freq: Once | INTRAMUSCULAR | Status: AC | PRN
  Administered 2020-03-05: 100 mL via INTRAVENOUS

## 2020-03-05 NOTE — ED Notes (Signed)
Pt to CT

## 2020-03-05 NOTE — Discharge Instructions (Addendum)
Covid antigen test was negative.  But formal test done.  Still suspicious that she may have Covid infection.  Take Tylenol as needed for the fevers.  CT scan showed pancolitis.  Without any complicating factors.  Contact your gastroenterologist in [place] your lab results and CT results have been printed for you to go over that information with them.  We discussed steroids at this point in time but understand that you feel that you do not need that that is okay.  Return for any new or worse symptoms.  Rest the lab work-up without evidence of infection and chest x-ray without evidence of infection.

## 2020-03-05 NOTE — ED Triage Notes (Signed)
Pt c/o feeling dizzy earlier today and nausea

## 2020-03-05 NOTE — ED Triage Notes (Signed)
Pt sent here from urgent care for high heart rate (140's). Pt c/o generalized weakness, nausea, post-partum anxiety, and decreased appetite.

## 2020-03-05 NOTE — ED Provider Notes (Signed)
[facility] EMERGENCY DEPARTMENT Provider Note   CSN: [id number] Arrival date & time: 03/05/20  1844     History Chief Complaint  Patient presents with  . Weakness    Laura Vaughn is a 25 y.o. female.  Patient sent in from urgent care for evaluation.  Feel they were concerned about tachycardia.  Upon arrival here patient developed fever.  Patient was feeling fine until today when she sort of had generalized weakness and malaise.  No cough no shortness of breath does have a little bit of low back pain.  Sort of like body aches.  Nausea a for a few weeks.  No vomiting no diarrhea.  Patient has a history of ulcerative colitis.  But no significant abdominal pain.  She feels that her discomfort that she has is baseline for her.  Patient has not had any of the Covid vaccinations.  The patient had a positive Covid infection in September 2021.        Past Medical History:  Diagnosis Date  . ADHD (attention deficit hyperactivity disorder)   . Anemia   . History of necrotizing fasciitis    vulvar and vaginal after last delivery  . Ulcerative colitis [facility])     Patient Active Problem List   Diagnosis Date Noted  . S/P cesarean section 06/28/2019  . Indication for care in labor or delivery 06/28/2019  . ADD (attention deficit disorder) without hyperactivity 04/11/2018  . Crohn's disease without complication ([place]) 81/85/6314  . Colostomy in place [facility]) 11/23/2016    Past Surgical History:  Procedure Laterality Date  . ABDOMINAL DEBRIDEMENT  2018   x7  . CESAREAN SECTION N/A 06/28/2019   Procedure: CESAREAN SECTION;  Surgeon: Cheri Fowler, MD;  Location: [facility] LD ORS;  Service: Obstetrics;  Laterality: N/A;  request RNFA  . COLON SURGERY    . OSTOMY  2018  . ostomy reversal  2020  . VAGINA SURGERY       OB History    Gravida  2   Para  2   Term  2   Preterm      AB      Living  2     SAB      IAB      Ectopic      Multiple  0   Live Births  2            Family History  Problem Relation Age of Onset  . ADD / ADHD Sister   . Bipolar disorder Paternal Grandmother   . ADD / ADHD Sister   . Diabetes Father     Social History   Tobacco Use  . Smoking status: Never Smoker  . Smokeless tobacco: Never Used  Vaping Use  . Vaping Use: Never used  Substance Use Topics  . Alcohol use: Never  . Drug use: Never    Home Medications Prior to Admission medications   Medication Sig Start Date End Date Taking? Authorizing Provider  hydrOXYzine (ATARAX/VISTARIL) 10 MG tablet Take 10 mg by mouth 3 (three) times daily as needed for anxiety. 02/20/20  Yes [provider]  traZODone (DESYREL) 100 MG tablet Take 100 mg by mouth at bedtime. 02/29/20  Yes [provider]  venlafaxine XR (EFFEXOR-XR) 75 MG 24 hr capsule Take 75 mg by mouth daily. 02/29/20  Yes [provider]    Allergies    Patient has no known allergies.  Review of Systems   Review of Systems  Constitutional: Positive  for fatigue and fever.  Gastrointestinal: Positive for abdominal pain and nausea.  Musculoskeletal: Positive for back pain and myalgias.  Neurological: Positive for weakness.    Physical Exam Updated Vital Signs BP 112/72   Pulse 97   Temp (!) 101.5 F (38.6 C) (Oral)   Resp 14   Ht 1.727 m (5' 8" )   Wt 72.6 kg   SpO2 99%   BMI 24.34 kg/m   Physical Exam Vitals and nursing note reviewed.  Constitutional:      General: She is not in acute distress.    Appearance: Normal appearance. She is well-developed and well-nourished. She is not ill-appearing.  HENT:     Head: Normocephalic and atraumatic.  Eyes:     Extraocular Movements: Extraocular movements intact.     Conjunctiva/sclera: Conjunctivae normal.     Pupils: Pupils are equal, round, and reactive to light.  Cardiovascular:     Rate and Rhythm: Normal rate and regular rhythm.     Heart sounds: No murmur heard.   Pulmonary:     Effort: Pulmonary effort is normal.  No respiratory distress.     Breath sounds: Normal breath sounds.  Abdominal:     Palpations: Abdomen is soft.     Tenderness: There is no abdominal tenderness.  Musculoskeletal:        General: No edema. Normal range of motion.     Cervical back: Neck supple.  Skin:    General: Skin is warm and dry.     Capillary Refill: Capillary refill takes less than 2 seconds.  Neurological:     General: No focal deficit present.     Mental Status: She is alert and oriented to person, place, and time.     Cranial Nerves: No cranial nerve deficit.     Sensory: No sensory deficit.     Motor: No weakness.     Coordination: Coordination normal.  Psychiatric:        Mood and Affect: Mood and affect normal.     ED Results / Procedures / Treatments   Labs (all labs ordered are listed, but only abnormal results are displayed) Labs Reviewed  BASIC METABOLIC PANEL - Abnormal; Notable for the following components:      Result Value   Sodium 134 (*)    All other components within normal limits  CBC - Abnormal; Notable for the following components:   Platelets 426 (*)    All other components within normal limits  URINALYSIS, ROUTINE W REFLEX MICROSCOPIC - Abnormal; Notable for the following components:   Ketones, ur 80 (*)    All other components within normal limits  SARS CORONAVIRUS 2 (TAT 6-24 HRS)  HEPATIC FUNCTION PANEL  CBG MONITORING, ED  POC URINE PREG, ED  POC SARS CORONAVIRUS 2 AG -  ED    EKG EKG Interpretation  Date/Time:  Thursday March 05 2020 19:22:26 EST Ventricular Rate:  123 PR Interval:    QRS Duration: 77 QT Interval:  278 QTC Calculation: 398 R Axis:   91 Text Interpretation: Sinus tachycardia Borderline right axis deviation Borderline repolarization abnormality No previous ECGs available Confirmed by Fredia Sorrow [phone]) on 03/05/2020 7:24:37 PM   Radiology CT Abdomen Pelvis W Contrast  Result Date: 03/05/2020 CLINICAL DATA:  Abdominal pain, nonlocalized  acute. History of ulcerative colitis. Fever. Tachycardic. EXAM: CT ABDOMEN AND PELVIS WITH CONTRAST TECHNIQUE: Multidetector CT imaging of the abdomen and pelvis was performed using the standard protocol following bolus administration of intravenous contrast. CONTRAST:  115m OMNIPAQUE IOHEXOL 300 MG/ML  SOLN COMPARISON:  None. FINDINGS: Lower chest: Left base atelectasis.  No acute abnormality. Hepatobiliary: No focal liver abnormality. The gallbladder is contracted. No gallstones, gallbladder wall thickening, or pericholecystic fluid. No biliary dilatation. Pancreas: No focal lesion. Normal pancreatic contour. No surrounding inflammatory changes. No main pancreatic ductal dilatation. Spleen: Normal in size without focal abnormality. Adrenals/Urinary Tract: No adrenal nodule bilaterally. Bilateral kidneys enhance symmetrically. No hydronephrosis. No hydroureter. The urinary bladder is unremarkable. Stomach/Bowel: Stomach is within normal limits. Circumferential bowel wall thickening and mucosal hyperemia of the entire large bowel with associated fluid-filled lumen. Loss of haustral of the large bowel. Small bowel wall thickening and mucosal hyperemia of a short segment of the terminal ileum. No pneumatosis. The appendix is borderline enlarged in caliber measuring up to 7-8 mm. Vascular/Lymphatic: Engorgement of the left ovarian veins. No abdominal aorta or iliac aneurysm. No atherosclerotic plaque of the aorta and its branches. Scattered prominent but nonenlarged mesenteric lymph nodes. No abdominal, pelvic, or inguinal lymphadenopathy. Reproductive: Uterus and bilateral adnexa are unremarkable. Other: No intraperitoneal free fluid. No intraperitoneal free gas. No organized fluid collection. Musculoskeletal: Bilateral L5 pars interarticularis defects. No acute or significant osseous findings. IMPRESSION: 1. Pancolitis with associated inflammatory changes of a short segment of terminal ileum and the appendix  consistent with given history of ulcerative colitis. Differential diagnosis includes infectious etiology versus less likely ischemic etiology. 2. Scattered but nonenlarged mesenteric lymph nodes that are likely reactive in etiology. 3. 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 Electronically Signed   By: MIven FinnM.D.   On: 03/05/2020 22:55   DG Chest Port 1 View  Result Date: 03/05/2020 CLINICAL DATA:  Fever EXAM: PORTABLE CHEST 1 VIEW COMPARISON:  None. FINDINGS: The heart size and mediastinal contours are within normal limits. Both lungs are clear. Right convex scoliosis. IMPRESSION: No active disease. Electronically Signed   By: KUlyses JarredM.D.   On: 03/05/2020 20:34    Procedures Procedures   Medications Ordered in ED Medications  acetaminophen (TYLENOL) tablet 650 mg (650 mg Oral Given 03/05/20 2012)  iohexol (OMNIPAQUE) 300 MG/ML solution 100 mL (100 mLs Intravenous Contrast Given 03/05/20 2239)    ED Course  I have reviewed the triage vital signs and the nursing notes.  Pertinent labs & imaging results that were available during my care of the patient were reviewed by me and considered in my medical decision making (see chart for details).    MDM Rules/Calculators/A&P                         Work-up for the fever without any acute findings.  No leukocytosis.  Chest x-ray negative urinalysis negative.  Labs without any significant abnormalities.  Covid antigen test was negative.  Formal PCR Covid test has been completed and is pending.  CT scan showed pancolitis.  Without any complicating factors.  Had a discussion with patient whether she felt she needed to start steroids.  She felt not.  [place].  Will provide patient with lab results.  Clinically still feel that patient may very well have recurrent Covid infection with omicron variant based on the fever.  Because  of not able to come up with any other specific findings.  Patient nontoxic no acute distress     Final Clinical Impression(s) / ED Diagnoses Final diagnoses:  Suspected COVID-19 virus  infection  Ulcerative pancolitis without complication [facility])    Rx / DC Orders ED Discharge Orders    None       Fredia Sorrow, MD 03/05/20 2332

## 2020-03-05 NOTE — ED Notes (Addendum)
.  ucto 

## 2020-03-06 LAB — SARS CORONAVIRUS 2 (TAT 6-24 HRS): SARS Coronavirus 2: NEGATIVE

## 2020-03-08 ENCOUNTER — Emergency Department (HOSPITAL_COMMUNITY): Payer: Commercial Managed Care - PPO

## 2020-03-08 ENCOUNTER — Inpatient Hospital Stay (HOSPITAL_COMMUNITY): Payer: Commercial Managed Care - PPO

## 2020-03-08 ENCOUNTER — Encounter (HOSPITAL_COMMUNITY): Payer: Self-pay

## 2020-03-08 ENCOUNTER — Other Ambulatory Visit: Payer: Self-pay

## 2020-03-08 ENCOUNTER — Inpatient Hospital Stay (HOSPITAL_COMMUNITY)
Admission: EM | Admit: 2020-03-08 | Discharge: 2020-03-10 | DRG: 872 | Disposition: A | Discharge status: Home / Self Care | Payer: Commercial Managed Care - PPO | Attending: Family Medicine | Admitting: Family Medicine

## 2020-03-08 DIAGNOSIS — Z8616 Personal history of COVID-19: Secondary | ICD-10-CM | POA: Diagnosis not present

## 2020-03-08 DIAGNOSIS — K5100 Ulcerative (chronic) pancolitis without complications: Secondary | ICD-10-CM | POA: Diagnosis present

## 2020-03-08 DIAGNOSIS — R6510 Systemic inflammatory response syndrome (SIRS) of non-infectious origin without acute organ dysfunction: Secondary | ICD-10-CM

## 2020-03-08 DIAGNOSIS — K5190 Ulcerative colitis, unspecified, without complications: Secondary | ICD-10-CM | POA: Diagnosis present

## 2020-03-08 DIAGNOSIS — R933 Abnormal findings on diagnostic imaging of other parts of digestive tract: Secondary | ICD-10-CM

## 2020-03-08 DIAGNOSIS — F988 Other specified behavioral and emotional disorders with onset usually occurring in childhood and adolescence: Secondary | ICD-10-CM | POA: Diagnosis present

## 2020-03-08 DIAGNOSIS — R000 Tachycardia, unspecified: Secondary | ICD-10-CM

## 2020-03-08 DIAGNOSIS — Z79899 Other long term (current) drug therapy: Secondary | ICD-10-CM | POA: Diagnosis not present

## 2020-03-08 DIAGNOSIS — R7989 Other specified abnormal findings of blood chemistry: Secondary | ICD-10-CM

## 2020-03-08 DIAGNOSIS — K8309 Other cholangitis: Secondary | ICD-10-CM | POA: Diagnosis present

## 2020-03-08 DIAGNOSIS — A419 Sepsis, unspecified organism: Secondary | ICD-10-CM | POA: Diagnosis present

## 2020-03-08 DIAGNOSIS — R6520 Severe sepsis without septic shock: Secondary | ICD-10-CM | POA: Diagnosis present

## 2020-03-08 LAB — CBC WITH DIFFERENTIAL/PLATELET
Abs Immature Granulocytes: 0.02 10*3/uL (ref 0.00–0.07)
Basophils Absolute: 0.1 10*3/uL (ref 0.0–0.1)
Basophils Relative: 1 %
Eosinophils Absolute: 0.1 10*3/uL (ref 0.0–0.5)
Eosinophils Relative: 1 %
HCT: 42.5 % (ref 36.0–46.0)
Hemoglobin: 13.9 g/dL (ref 12.0–15.0)
Immature Granulocytes: 0 %
Lymphocytes Relative: 10 %
Lymphs Abs: 0.9 10*3/uL (ref 0.7–4.0)
MCH: 28.1 pg (ref 26.0–34.0)
MCHC: 32.7 g/dL (ref 30.0–36.0)
MCV: 85.9 fL (ref 80.0–100.0)
Monocytes Absolute: 0.5 10*3/uL (ref 0.1–1.0)
Monocytes Relative: 5 %
Neutro Abs: 8.1 10*3/uL — ABNORMAL HIGH (ref 1.7–7.7)
Neutrophils Relative %: 83 %
Platelets: 437 10*3/uL — ABNORMAL HIGH (ref 150–400)
RBC: 4.95 MIL/uL (ref 3.87–5.11)
RDW: 14.4 % (ref 11.5–15.5)
WBC: 9.7 10*3/uL (ref 4.0–10.5)
nRBC: 0 % (ref 0.0–0.2)

## 2020-03-08 LAB — COMPREHENSIVE METABOLIC PANEL
ALT: 147 U/L — ABNORMAL HIGH (ref 0–44)
AST: 127 U/L — ABNORMAL HIGH (ref 15–41)
Albumin: 4.2 g/dL (ref 3.5–5.0)
Alkaline Phosphatase: 141 U/L — ABNORMAL HIGH (ref 38–126)
Anion gap: 12 (ref 5–15)
BUN: 7 mg/dL (ref 6–20)
CO2: 23 mmol/L (ref 22–32)
Calcium: 9.3 mg/dL (ref 8.9–10.3)
Chloride: 103 mmol/L (ref 98–111)
Creatinine, Ser: 0.85 mg/dL (ref 0.44–1.00)
GFR, Estimated: >60 mL/min (ref >60)
Glucose, Bld: 107 mg/dL — ABNORMAL HIGH (ref 70–99)
Potassium: 3.9 mmol/L (ref 3.5–5.1)
Sodium: 138 mmol/L (ref 135–145)
Total Bilirubin: 0.6 mg/dL (ref 0.3–1.2)
Total Protein: 8.5 g/dL — ABNORMAL HIGH (ref 6.5–8.1)

## 2020-03-08 LAB — CBC
HCT: 35.1 % — ABNORMAL LOW (ref 36.0–46.0)
Hemoglobin: 11.5 g/dL — ABNORMAL LOW (ref 12.0–15.0)
MCH: 28.3 pg (ref 26.0–34.0)
MCHC: 32.8 g/dL (ref 30.0–36.0)
MCV: 86.5 fL (ref 80.0–100.0)
Platelets: 362 10*3/uL (ref 150–400)
RBC: 4.06 MIL/uL (ref 3.87–5.11)
RDW: 14.4 % (ref 11.5–15.5)
WBC: 8.1 10*3/uL (ref 4.0–10.5)
nRBC: 0 % (ref 0.0–0.2)

## 2020-03-08 LAB — URINALYSIS, ROUTINE W REFLEX MICROSCOPIC
Bilirubin Urine: NEGATIVE
Glucose, UA: NEGATIVE mg/dL
Hgb urine dipstick: NEGATIVE
Ketones, ur: 5 mg/dL — AB
Leukocytes,Ua: NEGATIVE
Nitrite: NEGATIVE
Protein, ur: NEGATIVE mg/dL
Specific Gravity, Urine: 1.014 (ref 1.005–1.030)
pH: 5 (ref 5.0–8.0)

## 2020-03-08 LAB — C DIFFICILE QUICK SCREEN W PCR REFLEX
C Diff antigen: NEGATIVE
C Diff interpretation: NOT DETECTED
C Diff toxin: NEGATIVE

## 2020-03-08 LAB — RAPID URINE DRUG SCREEN, HOSP PERFORMED
Amphetamines: NOT DETECTED
Barbiturates: NOT DETECTED
Benzodiazepines: NOT DETECTED
Cocaine: NOT DETECTED
Opiates: NOT DETECTED
Tetrahydrocannabinol: NOT DETECTED

## 2020-03-08 LAB — LACTIC ACID, PLASMA
Lactic Acid, Venous: 0.9 mmol/L (ref 0.5–1.9)
Lactic Acid, Venous: 10.2 mmol/L (ref 0.5–1.9)
Lactic Acid, Venous: 1 mmol/L (ref 0.5–1.9)
Lactic Acid, Venous: 0.8 mmol/L (ref 0.5–1.9)
Lactic Acid, Venous: 0.7 mmol/L (ref 0.5–1.9)

## 2020-03-08 LAB — LIPASE, BLOOD: Lipase: 30 U/L (ref 11–51)

## 2020-03-08 LAB — I-STAT BETA HCG BLOOD, ED (MC, WL, AP ONLY): I-stat hCG, quantitative: <5 m[IU]/mL (ref <5)

## 2020-03-08 LAB — RESP PANEL BY RT-PCR (FLU A&B, COVID) ARPGX2
Influenza A by PCR: NEGATIVE
Influenza B by PCR: NEGATIVE
SARS Coronavirus 2 by RT PCR: NEGATIVE

## 2020-03-08 LAB — CREATININE, SERUM
Creatinine, Ser: 0.81 mg/dL (ref 0.44–1.00)
GFR, Estimated: >60 mL/min (ref >60)

## 2020-03-08 LAB — APTT: aPTT: 37 seconds — ABNORMAL HIGH (ref 24–36)

## 2020-03-08 LAB — SALICYLATE LEVEL: Salicylate Lvl: <7 mg/dL — ABNORMAL LOW (ref 7.0–30.0)

## 2020-03-08 LAB — PROTIME-INR
INR: 1.2 (ref 0.8–1.2)
Prothrombin Time: 15.2 seconds (ref 11.4–15.2)

## 2020-03-08 LAB — MAGNESIUM: Magnesium: 1.5 mg/dL — ABNORMAL LOW (ref 1.7–2.4)

## 2020-03-08 LAB — TSH: TSH: 1.858 u[IU]/mL (ref 0.350–4.500)

## 2020-03-08 LAB — ETHANOL: Alcohol, Ethyl (B): <10 mg/dL (ref <10)

## 2020-03-08 LAB — ACETAMINOPHEN LEVEL: Acetaminophen (Tylenol), Serum: <10 ug/mL — ABNORMAL LOW (ref 10–30)

## 2020-03-08 MED ORDER — METHYLPREDNISOLONE SODIUM SUCC 125 MG IJ SOLR
125.0000 mg | Freq: Once | INTRAMUSCULAR | Status: AC
  Administered 2020-03-08: 125 mg via INTRAVENOUS
  Filled 2020-03-08: qty 2

## 2020-03-08 MED ORDER — ONDANSETRON HCL 4 MG PO TABS
4.0000 mg | ORAL_TABLET | Freq: Four times a day (QID) | ORAL | Status: DC | PRN
  Administered 2020-03-09: 4 mg via ORAL

## 2020-03-08 MED ORDER — LACTATED RINGERS IV BOLUS (SEPSIS)
250.0000 mL | Freq: Once | INTRAVENOUS | Status: AC
Start: 2020-03-08 — End: 2020-03-08
  Administered 2020-03-08: 250 mL via INTRAVENOUS

## 2020-03-08 MED ORDER — LACTATED RINGERS IV BOLUS (SEPSIS)
1000.0000 mL | Freq: Once | INTRAVENOUS | Status: AC
  Administered 2020-03-08: 1000 mL via INTRAVENOUS

## 2020-03-08 MED ORDER — ACETAMINOPHEN 325 MG PO TABS
650.0000 mg | ORAL_TABLET | Freq: Once | ORAL | Status: AC
  Administered 2020-03-08: 650 mg via ORAL
  Filled 2020-03-08: qty 2

## 2020-03-08 MED ORDER — SODIUM CHLORIDE 0.9 % IV SOLN
2.0000 g | INTRAVENOUS | Status: DC
  Administered 2020-03-09: 2 g via INTRAVENOUS
  Filled 2020-03-08: qty 20
  Filled 2020-03-08: qty 2

## 2020-03-08 MED ORDER — ONDANSETRON HCL 4 MG/2ML IJ SOLN
4.0000 mg | Freq: Four times a day (QID) | INTRAMUSCULAR | Status: DC | PRN
  Administered 2020-03-08 – 2020-03-09 (×2): 4 mg via INTRAVENOUS
  Filled 2020-03-08 (×3): qty 2

## 2020-03-08 MED ORDER — IOHEXOL 300 MG/ML  SOLN
100.0000 mL | Freq: Once | INTRAMUSCULAR | Status: AC | PRN
  Administered 2020-03-08: 100 mL via INTRAVENOUS

## 2020-03-08 MED ORDER — SODIUM CHLORIDE 0.9 % IV SOLN
2.0000 g | Freq: Once | INTRAVENOUS | Status: AC
  Administered 2020-03-08: 2 g via INTRAVENOUS
  Filled 2020-03-08: qty 20

## 2020-03-08 MED ORDER — ACETAMINOPHEN 650 MG RE SUPP: 650.0000 mg | Freq: Four times a day (QID) | RECTAL | Status: DC | PRN

## 2020-03-08 MED ORDER — ACETAMINOPHEN 325 MG PO TABS: 650.0000 mg | ORAL_TABLET | Freq: Four times a day (QID) | ORAL | Status: DC | PRN

## 2020-03-08 MED ORDER — ENOXAPARIN SODIUM 40 MG/0.4ML ~~LOC~~ SOLN
40.0000 mg | SUBCUTANEOUS | Status: DC
  Administered 2020-03-08 – 2020-03-09 (×2): 40 mg via SUBCUTANEOUS
  Filled 2020-03-08 (×2): qty 0.4

## 2020-03-08 MED ORDER — POTASSIUM CHLORIDE IN NACL 20-0.9 MEQ/L-% IV SOLN
INTRAVENOUS | Status: DC
  Filled 2020-03-08 (×5): qty 1000

## 2020-03-08 MED ORDER — OXYCODONE HCL 5 MG PO TABS
5.0000 mg | ORAL_TABLET | ORAL | Status: DC | PRN
  Administered 2020-03-08 (×2): 5 mg via ORAL
  Filled 2020-03-08 (×2): qty 1

## 2020-03-08 MED ORDER — METRONIDAZOLE IN NACL 5-0.79 MG/ML-% IV SOLN
500.0000 mg | Freq: Three times a day (TID) | INTRAVENOUS | Status: DC
  Administered 2020-03-08 – 2020-03-10 (×5): 500 mg via INTRAVENOUS
  Filled 2020-03-08 (×5): qty 100

## 2020-03-08 MED ORDER — LACTATED RINGERS IV SOLN: INTRAVENOUS | Status: AC

## 2020-03-08 MED ORDER — METRONIDAZOLE IN NACL 5-0.79 MG/ML-% IV SOLN
500.0000 mg | Freq: Once | INTRAVENOUS | Status: AC
  Administered 2020-03-08: 500 mg via INTRAVENOUS
  Filled 2020-03-08: qty 100

## 2020-03-08 MED ORDER — VENLAFAXINE HCL ER 75 MG PO CP24
75.0000 mg | ORAL_CAPSULE | Freq: Every day | ORAL | Status: DC
  Administered 2020-03-09 – 2020-03-10 (×2): 75 mg via ORAL
  Filled 2020-03-08 (×2): qty 1

## 2020-03-08 NOTE — ED Notes (Signed)
Call floor to give report and the want report in 10 min.

## 2020-03-08 NOTE — Sepsis Progress Note (Signed)
elink is monitoring this sepsis

## 2020-03-08 NOTE — ED Provider Notes (Signed)
Care assumed from S. Caccavale PA-C at shift change pending abdominal US and consult for admission.  See her note for full H&P, exam, w/u.  Briefly this is a 25 year old female with history of UC presenting with fever, abdominal pain, nausea vomiting and tachycardia.  She was seen in the ED x3 days ago for similar symptoms and had a CT scan that showed pancolitis.  She elected not to start steroids for this. Work-up per previous provider includes calling code sepsis and giving an biotics for presumed intra-abdominal source.  Patient was given 30/cc kilogram fluid bolus per sepsis protocol.  Pertinent lab findings were elevated liver enzymes which is new compared to recent lab work.  Plan: admit to hospitalist for SIRs and persistent tachycardia once ultrasound results  Physical Exam  BP 103/75   Pulse (!) 130   Temp 99.6 F (37.6 C) (Oral)   Resp (!) 23   SpO2 92%   Physical Exam  PE: Constitutional: well-developed, well-nourished, no apparent distress HENT: normocephalic, atraumatic. no cervical adenopathy Cardiovascular: tachycardic, distal pulses intact Pulmonary/Chest: effort normal; breath sounds clear and equal bilaterally; no wheezes or rales Abdominal: soft, mild epigastric tenderness without peritoneal signs Musculoskeletal: full ROM, no edema Neurological: alert with goal directed thinking Skin: warm and dry, no rash, no diaphoresis Psychiatric: normal mood and affect, normal behavior   ED Course/Procedures     Results for orders placed or performed during the hospital encounter of 03/08/20 (from the past 24 hour(s))  I-Stat Beta hCG blood, ED ([facility], [facility], [facility] only)     Status: None   Collection Time: 03/08/20  1:09 PM  Result Value Ref Range   I-stat hCG, quantitative <5.0 <5 mIU/mL   Comment 3          CBC with Differential     Status: Abnormal   Collection Time: 03/08/20  1:11 PM  Result Value Ref Range   WBC 9.7 4.0 - 10.5 K/uL   RBC 4.95 3.87 - 5.11 MIL/uL    Hemoglobin 13.9 12.0 - 15.0 g/dL   HCT 42.5 36.0 - 46.0 %   MCV 85.9 80.0 - 100.0 fL   MCH 28.1 26.0 - 34.0 pg   MCHC 32.7 30.0 - 36.0 g/dL   RDW 14.4 11.5 - 15.5 %   Platelets 437 (H) 150 - 400 K/uL   nRBC 0.0 0.0 - 0.2 %   Neutrophils Relative % 83 %   Neutro Abs 8.1 (H) 1.7 - 7.7 K/uL   Lymphocytes Relative 10 %   Lymphs Abs 0.9 0.7 - 4.0 K/uL   Monocytes Relative 5 %   Monocytes Absolute 0.5 0.1 - 1.0 K/uL   Eosinophils Relative 1 %   Eosinophils Absolute 0.1 0.0 - 0.5 K/uL   Basophils Relative 1 %   Basophils Absolute 0.1 0.0 - 0.1 K/uL   Immature Granulocytes 0 %   Abs Immature Granulocytes 0.02 0.00 - 0.07 K/uL  Lipase, blood     Status: None   Collection Time: 03/08/20  1:11 PM  Result Value Ref Range   Lipase 30 11 - 51 U/L  Comprehensive metabolic panel     Status: Abnormal   Collection Time: 03/08/20  1:11 PM  Result Value Ref Range   Sodium 138 135 - 145 mmol/L   Potassium 3.9 3.5 - 5.1 mmol/L   Chloride 103 98 - 111 mmol/L   CO2 23 22 - 32 mmol/L   Glucose, Bld 107 (H) 70 - 99 mg/dL   BUN 7  6 - 20 mg/dL   Creatinine, Ser 0.85 0.44 - 1.00 mg/dL   Calcium 9.3 8.9 - 10.3 mg/dL   Total Protein 8.5 (H) 6.5 - 8.1 g/dL   Albumin 4.2 3.5 - 5.0 g/dL   AST 127 (H) 15 - 41 U/L   ALT 147 (H) 0 - 44 U/L   Alkaline Phosphatase 141 (H) 38 - 126 U/L   Total Bilirubin 0.6 0.3 - 1.2 mg/dL   GFR, Estimated >60 >60 mL/min   Anion gap 12 5 - 15  Lactic acid, plasma     Status: None   Collection Time: 03/08/20  1:11 PM  Result Value Ref Range   Lactic Acid, Venous 0.9 0.5 - 1.9 mmol/L  TSH     Status: None   Collection Time: 03/08/20  1:11 PM  Result Value Ref Range   TSH 1.858 0.350 - 4.500 uIU/mL  Blood culture (routine x 2)     Status: None (Preliminary result)   Collection Time: 03/08/20  1:13 PM   Specimen: BLOOD RIGHT HAND  Result Value Ref Range   Specimen Description      BLOOD RIGHT HAND Performed at [facility], [place] [address] [place], [place]  [address]    Special Requests      BOTTLES DRAWN AEROBIC AND ANAEROBIC Blood Culture adequate volume Performed at [facility] [address], [place], [place] [address]    Culture PENDING    Report Status PENDING   Urinalysis, Routine w reflex microscopic     Status: Abnormal   Collection Time: 03/08/20  3:40 PM  Result Value Ref Range   Color, Urine YELLOW YELLOW   APPearance CLEAR CLEAR   Specific Gravity, Urine 1.014 1.005 - 1.030   pH 5.0 5.0 - 8.0   Glucose, UA NEGATIVE NEGATIVE mg/dL   Hgb urine dipstick NEGATIVE NEGATIVE   Bilirubin Urine NEGATIVE NEGATIVE   Ketones, ur 5 (A) NEGATIVE mg/dL   Protein, ur NEGATIVE NEGATIVE mg/dL   Nitrite NEGATIVE NEGATIVE   Leukocytes,Ua NEGATIVE NEGATIVE  Resp Panel by RT-PCR (Flu A&B, Covid) Nasopharyngeal Swab     Status: None   Collection Time: 03/08/20  3:40 PM   Specimen: Nasopharyngeal Swab; Nasopharyngeal(NP) swabs in vial transport medium  Result Value Ref Range   SARS Coronavirus 2 by RT PCR NEGATIVE NEGATIVE   Influenza A by PCR NEGATIVE NEGATIVE   Influenza B by PCR NEGATIVE NEGATIVE    EXAM:  ULTRASOUND ABDOMEN LIMITED RIGHT UPPER QUADRANT    COMPARISON: 03/05/2020    FINDINGS:  Gallbladder:    No gallstones or wall thickening visualized. No sonographic Murphy  sign noted by sonographer.    Common bile duct:    Diameter: 2 mm    Liver:    No focal lesion identified. Within normal limits in parenchymal  echogenicity. Portal vein is patent on color Doppler imaging with  normal direction of blood flow towards the liver.    Other: None.    IMPRESSION:  Unremarkable exam      Electronically Signed  By: Constance Holster M.D.  On: 03/08/2020 15:27   CHEST 1 VIEW    COMPARISON: 03/05/2020 chest radiograph    FINDINGS:  The cardiomediastinal silhouette is unremarkable.    There is no evidence of focal airspace disease, pulmonary edema,  suspicious pulmonary  nodule/mass, pleural effusion, or pneumothorax.    No acute bony abnormalities are identified. A moderate apex RIGHT  LOWER thoracic scoliosis is again noted.  IMPRESSION:  No active disease.      Electronically Signed  By: Margarette Canada M.D.  On: 03/08/2020 14:12    MDM   Please see previous provider note to include MDM up to this point.  RUQ [place] without acute findings. She continues to be tachycardic despite fluids and now afebrile at 99.4. Spoke with Dr. Doristine Bosworth with hospitalist service who agrees to assume care of patient and bring into the hospital for further evaluation and management.     Portions of this note were generated with Lobby[employer]. Dictation errors may occur despite best attempts at proofreading.     Barrie Folk, PA-C 03/08/20 1651    Milton Ferguson, MD 03/09/20 [phone]

## 2020-03-08 NOTE — ED Provider Notes (Signed)
[facility]-EMERGENCY DEPT Provider Note   CSN: [id number] Arrival date & time: 03/08/20  1219     History Chief Complaint  Patient presents with  . Fatigue  . Generalized Body Aches  . Fever    Laura Vaughn is a [age] y.o. female presenting for evaluation of fever, n/v, abd pain, tachycardia, headache.   Pt states she has not been feeling well for the past 3 days.  She has had poor appetite and gradual weight loss over the past several weeks, however in the past couple days, she has had intermittent fevers, nausea, vomiting, abdominal pain, feeling her heart rate go up, and headaches.  [facility] was [facility], had overall reassuring work-up and was discharged.  She did have a CT that showed pancolitis, likely related to her UC.  She was not started on anything for this.  She felt better immediately after discharge, however this morning, she continued to feel worse.  She states she has noted her heart rate going up to 170 at home per her Fitbit.  She reports about 20 stools a day, no blood in her stool.  She has associated nausea and vomiting, no blood in her emesis.  She has been taking ibuprofen as needed for headache and fever, last dose earlier this am.  She has a complicated GI history including Fournier's gangrene complicating a vaginal birth and subsequent diagnosis of UC resulting in colostomy and reversal of ostomy.  She follows with [facility], but has not seen them recently.  She is not currently on any medication for UC, has not been for several months.  The only medicine she takes every day is venlafaxine, started 2 weeks ago for anxiety. She denies tobacco, alcohol, or drug use.  Denies significant Tylenol use.  She denies sick contacts.  She has had negative Covid testing since her symptoms began.  Additional history obtained from chart review.  I reviewed patient's most recent GI note and follow-up 2021.  I reviewed [facility] including [facility] and  lab results.  HPI     Past Medical History:  Diagnosis Date  . ADHD (attention deficit hyperactivity disorder)   . Anemia   . History of necrotizing fasciitis    vulvar and vaginal after last delivery  . Ulcerative colitis [facility](HCC)     Patient Active Problem List   Diagnosis Date Noted  . S/P cesarean section 06/28/2019  . Indication for care in labor or delivery 06/28/2019  . ADD (attention deficit disorder) without hyperactivity 04/11/2018  . Crohn's disease without complication (HCC) 03/07/2018  . 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 ABDOMINAL DEBRIDEMENT  2018   x7  . CESAREAN SECTION N/A 06/28/2019   Procedure: CESAREAN SECTION;  Surgeon: Lavina HammanMeisinger, Todd, MD;  Location: [facility] LD ORS;  Service: Obstetrics;  Laterality: N/A;  request RNFA  . COLON SURGERY    . OSTOMY  2018  . ostomy reversal  2020  . VAGINA SURGERY       OB History    Gravida  2   Para  2   Term  2   Preterm      AB      Living  2     SAB      IAB      Ectopic      Multiple  0   Live Births  2  Family History  Problem Relation Age of Onset  . ADD / ADHD Sister   . Bipolar disorder Paternal Grandmother   . ADD / ADHD Sister   . Diabetes Father     Social History   Tobacco Use  . Smoking status: Never Smoker  . Smokeless tobacco: Never Used  Vaping Use  . Vaping Use: Never used  Substance Use Topics  . Alcohol use: Never  . Drug use: Never    Home Medications Prior to Admission medications   Medication Sig Start Date End Date Taking? Authorizing Provider  hydrOXYzine (ATARAX/VISTARIL) 10 MG tablet Take 10 mg by mouth 3 (three) times daily as needed for anxiety. 02/20/20  Yes [provider]  traZODone (DESYREL) 100 MG tablet Take 100 mg by mouth at bedtime. 02/29/20  Yes [provider]  venlafaxine XR (EFFEXOR-XR) 75 MG 24 hr capsule Take 75 mg by mouth daily. 02/29/20  Yes [provider]    Allergies    Patient has no known allergies.  Review of Systems   Review of Systems  Constitutional: Positive for appetite change, fever and unexpected weight change.  Respiratory: Positive for shortness of breath.   Cardiovascular: Positive for palpitations.  Gastrointestinal: Positive for abdominal pain, diarrhea, nausea and vomiting.  Neurological: Positive for headaches.  All other systems reviewed and are negative.   Physical Exam Updated Vital Signs BP 103/75   Pulse (!) 130   Temp 99.6 F (37.6 C) (Oral)   Resp (!) 23   SpO2 92%   Physical Exam Vitals and nursing note reviewed.  Constitutional:      General: She is not in acute distress.    Appearance: She is well-developed and well-nourished.     Comments: Appears nontoxic  HENT:     Head: Normocephalic and atraumatic.     Mouth/Throat:     Mouth: Mucous membranes are moist.  Eyes:     Extraocular Movements: Extraocular movements intact and EOM normal.     Conjunctiva/sclera: Conjunctivae normal.     Pupils: Pupils are equal, round, and reactive to light.  Cardiovascular:     Rate and Rhythm: Regular rhythm. Tachycardia present.     Pulses: Normal pulses and intact distal pulses.     Comments: Tachycardic around 120 on my evaluation Pulmonary:     Effort: Pulmonary effort is normal. No respiratory distress.     Breath sounds: Normal breath sounds. No wheezing.     Comments: Clear lung sounds. Speaking in full sentences.  Abdominal:     General: There is no distension.     Palpations: Abdomen is soft. There is no mass.     Tenderness: There is abdominal tenderness. There is no guarding or rebound.     Comments: minimal ttp of epigastric abd.   Musculoskeletal:        General: Normal range of motion.     Cervical back: Normal range of motion and neck supple.  Skin:    General: Skin is warm and dry.     Capillary Refill: Capillary refill takes less than 2 seconds.  Neurological:      Mental Status: She is alert and oriented to person, place, and time.  Psychiatric:        Mood and Affect: Mood and affect normal.     ED Results / Procedures / Treatments   Labs (all labs ordered are listed, but only abnormal results are displayed) Labs Reviewed  CBC WITH DIFFERENTIAL/PLATELET - Abnormal; Notable for  the following components:      Result Value   Platelets 437 (*)    Neutro Abs 8.1 (*)    All other components within normal limits  COMPREHENSIVE METABOLIC PANEL - Abnormal; Notable for the following components:   Glucose, Bld 107 (*)    Total Protein 8.5 (*)    AST 127 (*)    ALT 147 (*)    Alkaline Phosphatase 141 (*)    All other components within normal limits  CULTURE, BLOOD (ROUTINE X 2)  CULTURE, BLOOD (ROUTINE X 2)  URINE CULTURE  C DIFFICILE QUICK SCREEN W PCR REFLEX  RESP PANEL BY RT-PCR (FLU A&B, COVID) ARPGX2  LIPASE, BLOOD  LACTIC ACID, PLASMA  TSH  LACTIC ACID, PLASMA  URINALYSIS, ROUTINE W REFLEX MICROSCOPIC  ETHANOL  SALICYLATE LEVEL  PROTIME-INR  APTT  HEPATITIS PANEL, ACUTE  I-STAT BETA HCG BLOOD, ED ([facility], [facility], [facility] ONLY)    EKG EKG Interpretation  Date/Time:  Sunday March 08 2020 12:50:58 EST Ventricular Rate:  138 PR Interval:    QRS Duration: 79 QT Interval:  276 QTC Calculation: 419 R Axis:   93 Text Interpretation: Age not entered, assumed to be  25 years old for purpose of ECG interpretation Sinus tachycardia Borderline right axis deviation Borderline T abnormalities, inferior leads Confirmed by Tilden Fossa [phone]) on 03/08/2020 1:34:19 PM   Radiology DG Chest 1 View  Result Date: 03/08/2020 CLINICAL DATA:  Fever, body aches and tachycardia for 3 days. EXAM: CHEST  1 VIEW COMPARISON:  03/05/2020 chest radiograph FINDINGS: The cardiomediastinal silhouette is unremarkable. There is no evidence of focal airspace disease, pulmonary edema, suspicious pulmonary nodule/mass, pleural effusion, or pneumothorax. No acute bony  abnormalities are identified. A moderate apex RIGHT LOWER thoracic scoliosis is again noted. IMPRESSION: No active disease. Electronically Signed   By: Harmon Pier M.D.   On: 03/08/2020 14:12    Procedures .Critical Care Performed by: Alveria Apley, PA-C Authorized by: Alveria Apley, PA-C   Critical care provider statement:    Critical care time (minutes):  45   Critical care time was exclusive of:  Separately billable procedures and treating other patients and teaching time   Critical care was necessary to treat or prevent imminent or life-threatening deterioration of the following conditions:  Sepsis   Critical care was time spent personally by me on the following activities:  Blood draw for specimens, development of treatment plan with patient or surrogate, evaluation of patient's response to treatment, examination of patient, obtaining history from patient or surrogate, ordering and performing treatments and interventions, ordering and review of laboratory studies, ordering and review of radiographic studies, pulse oximetry, re-evaluation of patient's condition and review of old charts   I assumed direction of critical care for this patient from another provider in my specialty: no     Care discussed with: admitting provider       Medications Ordered in ED Medications  lactated ringers infusion (has no administration in time range)  lactated ringers bolus 1,000 mL (1,000 mLs Intravenous New Bag/Given 03/08/20 1319)    And  lactated ringers bolus 1,000 mL (1,000 mLs Intravenous New Bag/Given 03/08/20 1437)    And  lactated ringers bolus 250 mL (has no administration in time range)  cefTRIAXone (ROCEPHIN) 2 g in sodium chloride 0.9 % 100 mL IVPB (2 g Intravenous New Bag/Given 03/08/20 1436)  metroNIDAZOLE (FLAGYL) IVPB 500 mg (500 mg Intravenous New Bag/Given 03/08/20 1318)  acetaminophen (TYLENOL) tablet 650 mg (650 mg Oral  Given 03/08/20 1454)    ED Course  I have reviewed the  triage vital signs and the nursing notes.  Pertinent labs & imaging results that were available during my care of the patient were reviewed by me and considered in my medical decision making (see chart for details).    MDM Rules/Calculators/A&P                          Patient presenting for evaluation of nausea, vomiting, abdominal pain, diarrhea, tachycardia, headaches.  On exam, patient meets SIRS criteria that she is febrile tachycardic.  While she had a reassuring work-up a few days ago, considering worsening symptoms today, will repeat labs.  Code sepsis called with a likely intra-abdominal source considering patient's history of UC.   Labs interpreted by me, overall reassuring, though LFTs are elevated when compared to previous.  However patient is remaining tachycardic.  Will obtain right upper quadrant ultrasound for evaluation of gallbladder.  She will likely need admission for continued tachycardia and concern for sepsis.  Pt signed out to Raelyn Mora, PA-C, for f/u on [place] and admission.  Final Clinical Impression(s) / ED Diagnoses Final diagnoses:  Elevated LFTs  Tachycardia  SIRS (systemic inflammatory response syndrome) [facility])    Rx / DC Orders ED Discharge Orders    None       Alveria Apley, PA-C 03/08/20 1518    Tilden Fossa, MD 03/10/20 [phone]

## 2020-03-08 NOTE — Plan of Care (Signed)

## 2020-03-08 NOTE — ED Notes (Signed)
ED TO INPATIENT HANDOFF REPORT  ED Nurse Name and Phone #: [phone]  S Name/Age/Gender Laura Vaughn 25 y.o. female Room/Bed: WA13/WA13  Code Status   Code Status: Full Code  Home/SNF/Other Home Patient oriented to: self, place, time and situation Is this baseline? Yes   Triage Complete: Triage complete  Chief Complaint Sepsis [facility]) [A41.9]  Triage Note Pt presents with c/o fatigue, fever, generalized body aches since Thursday. Pt was seen for same on 2/17 but symptoms have not resolved. Pt's HR is 150-160 in triage and she is febrile. Pt reports she was tested twice for Covid when she was previously seen and both were negative. Pt also reports pain in her abdomen but reports she has a hx of Chrohn's.     Allergies No Known Allergies  Level of Care/Admitting Diagnosis ED Disposition    ED Disposition Condition [facility]: [facility] [[id number]]  Level of Care: Med-Surg [16]  May admit patient to [facility] or [facility] if equivalent level of care is available:: No  Covid Evaluation: Asymptomatic Screening Protocol (No Symptoms)  Diagnosis: Sepsis [facility]) [[id number]]  Admitting Physician: Darliss Cheney [[id number]]  Attending Physician: Darliss Cheney [phone]  Estimated length of stay: 3 - 4 days  Certification:: I certify this patient will need inpatient services for at least 2 midnights       B Medical/Surgery History Past Medical History:  Diagnosis Date  . ADHD (attention deficit hyperactivity disorder)   . Anemia   . History of necrotizing fasciitis    vulvar and vaginal after last delivery  . Ulcerative colitis [facility])    Past Surgical History:  Procedure Laterality Date  . ABDOMINAL DEBRIDEMENT  2018   x7  . CESAREAN SECTION N/A 06/28/2019   Procedure: CESAREAN SECTION;  Surgeon: Cheri Fowler, MD;  Location: [facility] LD ORS;  Service: Obstetrics;  Laterality: N/A;  request RNFA  . COLON SURGERY    . OSTOMY  2018  . ostomy  reversal  2020  . VAGINA SURGERY       A IV Location/Drains/Wounds Patient Lines/Drains/Airways Status    Active Line/Drains/Airways    Name Placement date Placement time Site Days   Peripheral IV 03/08/20 Right Antecubital 03/08/20  1307  Antecubital  less than 1   Peripheral IV 03/08/20 Right Hand 03/08/20  1307  Hand  less than 1   Incision (Closed) 06/28/19 Abdomen 06/28/19  0804  -- 254          Intake/Output Last 24 hours  Intake/Output Summary (Last 24 hours) at 03/08/2020 1851 Last data filed at 03/08/2020 1602 Gross per 24 hour  Intake 2450 ml  Output --  Net 2450 ml    Labs/Imaging Results for orders placed or performed during the hospital encounter of 03/08/20 (from the past 48 hour(s))  I-Stat Beta hCG blood, ED ([facility], [facility], [facility] only)     Status: None   Collection Time: 03/08/20  1:09 PM  Result Value Ref Range   I-stat hCG, quantitative <5.0 <5 mIU/mL   Comment 3            Comment:   GEST. AGE      CONC.  (mIU/mL)   <=1 WEEK        5 - 50     2 WEEKS       50 - 500     3 WEEKS       100 - 10,000     4  WEEKS     1,000 - 30,000        FEMALE AND NON-PREGNANT FEMALE:     LESS THAN 5 mIU/mL   CBC with Differential     Status: Abnormal   Collection Time: 03/08/20  1:11 PM  Result Value Ref Range   WBC 9.7 4.0 - 10.5 K/uL   RBC 4.95 3.87 - 5.11 MIL/uL   Hemoglobin 13.9 12.0 - 15.0 g/dL   HCT 42.5 36.0 - 46.0 %   MCV 85.9 80.0 - 100.0 fL   MCH 28.1 26.0 - 34.0 pg   MCHC 32.7 30.0 - 36.0 g/dL   RDW 14.4 11.5 - 15.5 %   Platelets 437 (H) 150 - 400 K/uL   nRBC 0.0 0.0 - 0.2 %   Neutrophils Relative % 83 %   Neutro Abs 8.1 (H) 1.7 - 7.7 K/uL   Lymphocytes Relative 10 %   Lymphs Abs 0.9 0.7 - 4.0 K/uL   Monocytes Relative 5 %   Monocytes Absolute 0.5 0.1 - 1.0 K/uL   Eosinophils Relative 1 %   Eosinophils Absolute 0.1 0.0 - 0.5 K/uL   Basophils Relative 1 %   Basophils Absolute 0.1 0.0 - 0.1 K/uL   Immature Granulocytes 0 %   Abs Immature Granulocytes 0.02  0.00 - 0.07 K/uL    Comment: Performed at [facility], [place] [address], [place], [place] [address]  Lipase, blood     Status: None   Collection Time: 03/08/20  1:11 PM  Result Value Ref Range   Lipase 30 11 - 51 U/L    Comment: Performed at [facility], [place] [address] [place], [place] [address]  Comprehensive metabolic panel     Status: Abnormal   Collection Time: 03/08/20  1:11 PM  Result Value Ref Range   Sodium 138 135 - 145 mmol/L   Potassium 3.9 3.5 - 5.1 mmol/L   Chloride 103 98 - 111 mmol/L   CO2 23 22 - 32 mmol/L   Glucose, Bld 107 (H) 70 - 99 mg/dL    Comment: Glucose reference range applies only to samples taken after fasting for at least 8 hours.   BUN 7 6 - 20 mg/dL   Creatinine, Ser 0.85 0.44 - 1.00 mg/dL   Calcium 9.3 8.9 - 10.3 mg/dL   Total Protein 8.5 (H) 6.5 - 8.1 g/dL   Albumin 4.2 3.5 - 5.0 g/dL   AST 127 (H) 15 - 41 U/L   ALT 147 (H) 0 - 44 U/L   Alkaline Phosphatase 141 (H) 38 - 126 U/L   Total Bilirubin 0.6 0.3 - 1.2 mg/dL   GFR, Estimated >60 >60 mL/min    Comment: (NOTE) Calculated using the CKD-EPI Creatinine Equation (2021)    Anion gap 12 5 - 15    Comment: Performed at [facility], [place] [address], [place], [place] [address]  Lactic acid, plasma     Status: None   Collection Time: 03/08/20  1:11 PM  Result Value Ref Range   Lactic Acid, Venous 0.9 0.5 - 1.9 mmol/L    Comment: Performed at [facility], [place] [address], [place],  [address]  TSH     Status: None   Collection Time: 03/08/20  1:11 PM  Result Value Ref Range   TSH 1.858 0.350 - 4.500 uIU/mL    Comment: Performed by a 3rd Generation assay with a functional sensitivity of <=0.01 uIU/mL. Performed at [facility], [place] [address] [place], [place] [address]   Blood culture (routine x 2)     Status: None (Preliminary result)   Collection Time: 03/08/20  1:13 PM   Specimen: BLOOD RIGHT  HAND  Result Value Ref Range   Specimen Description      BLOOD RIGHT HAND Performed at [facility], [place] [address] [place], [place] [address]    Special Requests      BOTTLES DRAWN AEROBIC AND ANAEROBIC Blood Culture adequate volume Performed at [place] [address] [place], [place] [address]    Culture PENDING    Report Status PENDING   Lactic acid, plasma     Status: Abnormal   Collection Time: 03/08/20  3:40 PM  Result Value Ref Range   Lactic Acid, Venous 10.2 (HH) 0.5 - 1.9 mmol/L    Comment: REPEATED TO VERIFY CRITICAL RESULT CALLED TO, READ BACK BY AND VERIFIED WITH: WOODY,A AT 1656 ON 03/08/2020 BY JPM  Performed at [place] [address], [place], [place] [address]   Urinalysis, Routine w reflex microscopic     Status: Abnormal   Collection Time: 03/08/20  3:40 PM  Result Value Ref Range   Color, Urine YELLOW YELLOW   APPearance CLEAR CLEAR   Specific Gravity, Urine 1.014 1.005 - 1.030   pH 5.0 5.0 - 8.0   Glucose, UA NEGATIVE NEGATIVE mg/dL   Hgb urine dipstick NEGATIVE NEGATIVE   Bilirubin Urine NEGATIVE NEGATIVE   Ketones, ur 5 (A) NEGATIVE mg/dL   Protein, ur NEGATIVE NEGATIVE mg/dL   Nitrite NEGATIVE NEGATIVE   Leukocytes,Ua NEGATIVE NEGATIVE    Comment: Performed at [place] [address], [place], [place] [address]  Ethanol     Status: None   Collection Time: 03/08/20  3:40 PM  Result Value Ref Range   Alcohol, Ethyl (B) <10 <10 mg/dL    Comment: (NOTE) Lowest detectable limit for serum alcohol is 10 mg/dL.  For medical purposes only. Performed at [facility], [place] [address] [place], [place] [address]   Salicylate level     Status: Abnormal   Collection Time: 03/08/20  3:40 PM  Result Value Ref Range   Salicylate Lvl <7.0 (L) 7.0 - 30.0 mg/dL    Comment: Performed at [facility], [place] [address], [place], [place] [address]   Resp Panel by RT-PCR (Flu A&B, Covid) Nasopharyngeal Swab     Status: None   Collection Time: 03/08/20  3:40 PM   Specimen: Nasopharyngeal Swab; Nasopharyngeal(NP) swabs in vial transport medium  Result Value Ref Range   SARS Coronavirus 2 by RT PCR NEGATIVE NEGATIVE    Comment: (NOTE) SARS-CoV-2 target nucleic acids are NOT DETECTED.  The SARS-CoV-2 RNA is generally detectable in upper respiratory specimens during the acute phase of infection. The lowest concentration of SARS-CoV-2 viral copies this assay can detect is 138 copies/mL. A negative result does not preclude SARS-Cov-2 infection and should not be used as the sole basis for treatment or other patient management decisions. A negative result may occur with  improper specimen collection/handling, submission of specimen other than nasopharyngeal swab, presence of viral mutation(s) within the areas targeted by this assay, and inadequate number of viral copies(<138 copies/mL). A negative result must be combined with clinical observations, patient history, and epidemiological information. The expected result is Negative.  Fact Sheet for Patients:  EntrepreneurPulse.com.au  Fact Sheet for Healthcare Providers:  IncredibleEmployment.be  This test  is no t yet approved or cleared by the [place] and  has been authorized for detection and/or diagnosis of SARS-CoV-2 by FDA under an Emergency Use Authorization (EUA). This EUA will remain  in effect (meaning this test can be used) for the duration of the COVID-19 declaration under Section 564(b)(1) of the Act, 21 U.S.C.section 360bbb-3(b)(1), unless the authorization is terminated  or revoked sooner.       Influenza A by PCR NEGATIVE NEGATIVE   Influenza B by PCR NEGATIVE NEGATIVE    Comment: (NOTE) The Xpert Xpress SARS-CoV-2/FLU/RSV plus assay is intended as an aid in the diagnosis of influenza from Nasopharyngeal swab specimens  and should not be used as a sole basis for treatment. Nasal washings and aspirates are unacceptable for Xpert Xpress SARS-CoV-2/FLU/RSV testing.  Fact Sheet for Patients: EntrepreneurPulse.com.au  Fact Sheet for Healthcare Providers: IncredibleEmployment.be  This test is not yet approved or cleared by the [place] FDA and has been authorized for detection and/or diagnosis of SARS-CoV-2 by FDA under an Emergency Use Authorization (EUA). This EUA will remain in effect (meaning this test can be used) for the duration of the COVID-19 declaration under Section 564(b)(1) of the Act, 21 U.S.C. section 360bbb-3(b)(1), unless the authorization is terminated or revoked.  Performed at [facility], [place] [address], [place], [place] [address]   Acetaminophen level     Status: Abnormal   Collection Time: 03/08/20  3:40 PM  Result Value Ref Range   Acetaminophen (Tylenol), Serum <10 (L) 10 - 30 ug/mL    Comment: (NOTE) Therapeutic concentrations vary significantly. A range of 10-30 ug/mL  may be an effective concentration for many patients. However, some  are best treated at concentrations outside of this range. Acetaminophen concentrations >150 ug/mL at 4 hours after ingestion  and >50 ug/mL at 12 hours after ingestion are often associated with  toxic reactions.  Performed at [facility], [place] [address], [place], [place] [address]   Urine rapid drug screen (hosp performed)     Status: None   Collection Time: 03/08/20  3:40 PM  Result Value Ref Range   Opiates NONE DETECTED NONE DETECTED   Cocaine NONE DETECTED NONE DETECTED   Benzodiazepines NONE DETECTED NONE DETECTED   Amphetamines NONE DETECTED NONE DETECTED   Tetrahydrocannabinol NONE DETECTED NONE DETECTED   Barbiturates NONE DETECTED NONE DETECTED    Comment: (NOTE) DRUG SCREEN FOR MEDICAL PURPOSES ONLY.  IF CONFIRMATION IS NEEDED FOR ANY PURPOSE,  NOTIFY LAB WITHIN 5 DAYS.  LOWEST DETECTABLE LIMITS FOR URINE DRUG SCREEN Drug Class                     Cutoff (ng/mL) Amphetamine and metabolites    1000 Barbiturate and metabolites    200 Benzodiazepine                 591 Tricyclics and metabolites     300 Opiates and metabolites        300 Cocaine and metabolites        300 THC                            50 Performed at [facility], [place] [address] [place],  [address]   CBC     Status: Abnormal   Collection Time: 03/08/20  5:04 PM  Result Value Ref Range   WBC 8.1 4.0 - 10.5 K/uL  RBC 4.06 3.87 - 5.11 MIL/uL   Hemoglobin 11.5 (L) 12.0 - 15.0 g/dL   HCT 35.1 (L) 36.0 - 46.0 %   MCV 86.5 80.0 - 100.0 fL   MCH 28.3 26.0 - 34.0 pg   MCHC 32.8 30.0 - 36.0 g/dL   RDW 14.4 11.5 - 15.5 %   Platelets 362 150 - 400 K/uL   nRBC 0.0 0.0 - 0.2 %    Comment: Performed at [facility], [place] [address] [place], [place] [address]  Creatinine, serum     Status: None   Collection Time: 03/08/20  5:04 PM  Result Value Ref Range   Creatinine, Ser 0.81 0.44 - 1.00 mg/dL   GFR, Estimated >60 >60 mL/min    Comment: (NOTE) Calculated using the CKD-EPI Creatinine Equation (2021) Performed at [facility], [place] [address] [place], [place] [address]   Magnesium     Status: Abnormal   Collection Time: 03/08/20  5:04 PM  Result Value Ref Range   Magnesium 1.5 (L) 1.7 - 2.4 mg/dL    Comment: Performed at [facility], [place] [address], [place], [place] [address]  Lactic acid, plasma     Status: None   Collection Time: 03/08/20  5:34 PM  Result Value Ref Range   Lactic Acid, Venous 1.0 0.5 - 1.9 mmol/L    Comment: Performed at [facility], [place], [place] [address] [place], [place] [address]   DG Chest 1 View  Result Date: 03/08/2020 CLINICAL DATA:  Fever, body aches and tachycardia for 3 days. EXAM: CHEST  1 VIEW COMPARISON:  03/05/2020 chest  radiograph FINDINGS: The cardiomediastinal silhouette is unremarkable. There is no evidence of focal airspace disease, pulmonary edema, suspicious pulmonary nodule/mass, pleural effusion, or pneumothorax. No acute bony abnormalities are identified. A moderate apex RIGHT LOWER thoracic scoliosis is again noted. IMPRESSION: No active disease. Electronically Signed   By: Margarette Canada M.D.   On: 03/08/2020 14:12   US Abdomen Limited RUQ (LIVER/GB)  Result Date: 03/08/2020 CLINICAL DATA:  Elevated LFTs EXAM: ULTRASOUND ABDOMEN LIMITED RIGHT UPPER QUADRANT COMPARISON:  03/05/2020 FINDINGS: Gallbladder: No gallstones or wall thickening visualized. No sonographic Murphy sign noted by sonographer. Common bile duct: Diameter: 2 mm Liver: No focal lesion identified. Within normal limits in parenchymal echogenicity. Portal vein is patent on color Doppler imaging with normal direction of blood flow towards the liver. Other: None. IMPRESSION: Unremarkable exam Electronically Signed   By: Constance Holster M.D.   On: 03/08/2020 15:27    Pending Labs Unresulted Labs (From admission, onward)          Start     Ordered   03/15/20 0500  Creatinine, serum  (enoxaparin (LOVENOX)    CrCl >/= 30 ml/min)  Weekly,   R     Comments: while on enoxaparin therapy    03/08/20 1707   03/09/20 0500  Protime-INR  Tomorrow morning,   R        03/08/20 1707   03/09/20 0500  Cortisol-am, blood  Tomorrow morning,   R        03/08/20 1707   03/09/20 0500  Procalcitonin  Tomorrow morning,   R        03/08/20 1707   03/09/20 0500  Comprehensive metabolic panel  Tomorrow morning,   R        03/08/20 1707   03/08/20 1900  Lactic acid, plasma  STAT Now then every 3 hours,   R (with  STAT occurrences)      03/08/20 1709   03/08/20 1748  Protime-INR  Once,   R        03/08/20 1748   03/08/20 1748  APTT  Once,   R        03/08/20 1748   03/08/20 1704  HIV Antibody (routine testing w rflx)  (HIV Antibody (Routine testing w reflex)  panel)  Once,   STAT        03/08/20 1707   03/08/20 1405  Hepatitis panel, acute  ONCE - STAT,   STAT        03/08/20 1404   03/08/20 1311  C Difficile Quick Screen w PCR reflex  (C Difficile quick screen w PCR reflex panel)  Once, for 24 hours,   STAT       References:    CDiff Information Tool   03/08/20 1310   03/08/20 1304  Urine culture  ONCE - STAT,   STAT        03/08/20 1303   03/08/20 1303  Blood culture (routine x 2)  BLOOD CULTURE X 2,   STAT      03/08/20 1303          Vitals/Pain Today's Vitals   03/08/20 1445 03/08/20 1528 03/08/20 1610 03/08/20 1810  BP: 103/75 115/72    Pulse: (!) 130 (!) 114    Resp: (!) 23 20    Temp:   99.4 F (37.4 C)   TempSrc:   Oral   SpO2: 92% 100%    PainSc:    7     Isolation Precautions Contact and Enteric precautions (UV disinfection)  Medications Medications  lactated ringers infusion ( Intravenous New Bag/Given 03/08/20 1529)  venlafaxine XR (EFFEXOR-XR) 24 hr capsule 75 mg (has no administration in time range)  enoxaparin (LOVENOX) injection 40 mg (has no administration in time range)  0.9 % NaCl with KCl 20 mEq/ L  infusion (has no administration in time range)  cefTRIAXone (ROCEPHIN) 2 g in sodium chloride 0.9 % 100 mL IVPB (has no administration in time range)  metroNIDAZOLE (FLAGYL) IVPB 500 mg (has no administration in time range)  acetaminophen (TYLENOL) tablet 650 mg (has no administration in time range)    Or  acetaminophen (TYLENOL) suppository 650 mg (has no administration in time range)  oxyCODONE (Oxy IR/ROXICODONE) immediate release tablet 5 mg (5 mg Oral Given 03/08/20 1810)  ondansetron (ZOFRAN) tablet 4 mg ( Oral See Alternative 03/08/20 1809)    Or  ondansetron (ZOFRAN) injection 4 mg (4 mg Intravenous Given 03/08/20 1809)  cefTRIAXone (ROCEPHIN) 2 g in sodium chloride 0.9 % 100 mL IVPB (0 g Intravenous Stopped 03/08/20 1529)  metroNIDAZOLE (FLAGYL) IVPB 500 mg (0 mg Intravenous Stopped 03/08/20 1527)   lactated ringers bolus 1,000 mL (0 mLs Intravenous Stopped 03/08/20 1529)    And  lactated ringers bolus 1,000 mL (0 mLs Intravenous Stopped 03/08/20 1527)    And  lactated ringers bolus 250 mL (0 mLs Intravenous Stopped 03/08/20 1602)  acetaminophen (TYLENOL) tablet 650 mg (650 mg Oral Given 03/08/20 1454)  methylPREDNISolone sodium succinate (SOLU-MEDROL) 125 mg/2 mL injection 125 mg (125 mg Intravenous Given 03/08/20 1809)    Mobility walks Low fall risk   Focused Assessments .   R Recommendations: See Admitting Provider Note  Report given to:   Additional Notes: n/a

## 2020-03-08 NOTE — ED Triage Notes (Signed)
Pt presents with c/o fatigue, fever, generalized body aches since Thursday. Pt was seen for same on 2/17 but symptoms have not resolved. Pt's HR is 150-160 in triage and she is febrile. Pt reports she was tested twice for Covid when she was previously seen and both were negative. Pt also reports pain in her abdomen but reports she has a hx of Chrohn's.

## 2020-03-08 NOTE — H&P (Signed)
History and Physical    Laura Vaughn:[id number] DOB: 1995-06-07 DOA: 03/08/2020  PCP: Patient, No Pcp Per  Patient coming from: Home  I have personally briefly reviewed patient's old medical records in [place]  Chief Complaint: Abdominal pain, nausea vomiting and fever  HPI: Laura Vaughn is a 25 y.o. female with medical history significant of ADHD and ulcerative colitis presented to ED with multiple complaints including abdominal pain, nausea, vomiting and fever.  According to patient, she initially went to [place] on 03/05/2020 with major complaint of generalized weakness and some nausea.  Upon arrival to ED, she was febrile as well.  Abdominal CT was done which showed pancolitis consistent with her history of ulcerative colitis.  She felt better and there was no acute finding found so she was discharged home.  According to patient, she was not feeling that well even at the time of discharge however a day after discharge, she started having abdominal pain located at the periumbilical as well as epigastric region and then also started having fever with T-max of 101.3 at home.  The pain has been intermittent, aggravated with movement and eating and relieved with rest.  Ranges anywhere from 3-6 out of 10.  She has intermittent nausea and vomiting along with that and feels generalized weakness.  She claims that she has been losing weight gradually in last few months.  She used to be on some medications for ulcerative colitis but she stopped taking them about 6 months ago and her last visit to her GI at [facility] was about a year ago.  She has been trying to manage her ulcerative colitis by herself by ingesting/modifying her diet.  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  Patient also has noticed tachycardia at home with rates around 170 through her Fitbit device.  Patient denies eating or drinking  anything outside other than what ever she cooks at home.  There is no other sick contact, all the family members including 2 kids and husband are doing fine without any symptoms.  No recent travel.  She also tells me that on a regular day, she has approximately 10 watery bowel movements but last since 2 to 3 days, she has been having about 15 to 20 BMs /day.  ED Course: Upon arrival to ED, patient was once again febrile with 101, tachycardic at 151 and tachypneic.  CBC with normal white cells, CMP showed normal renal function but elevated LFTs and alkaline phosphatase.  Due to this abdominal ultrasound was done which is unremarkable.  Chest x-ray unremarkable as well.  Multiple labs are still pending including COVID-19, ethanol, UA, acute viral hepatitis panel.  Initial lactic acid was normal.  Repeat lactic acid is 10.  C. difficile is pending.  Review of Systems: As per HPI otherwise negative.    Past Medical History:  Diagnosis Date  . ADHD (attention deficit hyperactivity disorder)   . Anemia   . History of necrotizing fasciitis    vulvar and vaginal after last delivery  . Ulcerative colitis [facility])     Past Surgical History:  Procedure Laterality Date  . ABDOMINAL DEBRIDEMENT  2018   x7  . CESAREAN SECTION N/A 06/28/2019   Procedure: CESAREAN SECTION;  Surgeon: Cheri Fowler, MD;  Location: [facility] LD ORS;  Service: Obstetrics;  Laterality: N/A;  request RNFA  . COLON SURGERY    . OSTOMY  2018  .  ostomy reversal  2020  . VAGINA SURGERY       reports that she has never smoked. She has never used smokeless tobacco. She reports that she does not drink alcohol and does not use drugs.  No Known Allergies  Family History  Problem Relation Age of Onset  . ADD / ADHD Sister   . Bipolar disorder Paternal Grandmother   . ADD / ADHD Sister   . Diabetes Father     Prior to Admission medications   Medication Sig Start Date End Date Taking? Authorizing Provider  hydrOXYzine  (ATARAX/VISTARIL) 10 MG tablet Take 10 mg by mouth 3 (three) times daily as needed for anxiety. 02/20/20  Yes [provider]  traZODone (DESYREL) 100 MG tablet Take 100 mg by mouth at bedtime. 02/29/20  Yes [provider]  venlafaxine XR (EFFEXOR-XR) 75 MG 24 hr capsule Take 75 mg by mouth daily. 02/29/20  Yes [provider]    Physical Exam: Vitals:   03/08/20 1430 03/08/20 1445 03/08/20 1528 03/08/20 1610  BP: (!) 108/97 103/75 115/72   Pulse: (!) 116 (!) 130 (!) 114   Resp: (!) 30 (!) 23 20   Temp:    99.4 F (37.4 C)  TempSrc:    Oral  SpO2: (!) 86% 92% 100%     Constitutional: NAD, calm, comfortable Vitals:   03/08/20 1430 03/08/20 1445 03/08/20 1528 03/08/20 1610  BP: (!) 108/97 103/75 115/72   Pulse: (!) 116 (!) 130 (!) 114   Resp: (!) 30 (!) 23 20   Temp:    99.4 F (37.4 C)  TempSrc:    Oral  SpO2: (!) 86% 92% 100%    Eyes: PERRL, lids and conjunctivae normal ENMT: Mucous membranes are dry. Posterior pharynx clear of any exudate or lesions.Normal dentition.  Neck: normal, supple, no masses, no thyromegaly Respiratory: clear to auscultation bilaterally, no wheezing, no crackles. Normal respiratory effort. No accessory muscle use.  Cardiovascular: Regular rate and rhythm, no murmurs / rubs / gallops. No extremity edema. 2+ pedal pulses. No carotid bruits.  Abdomen: Very minimal epigastric tenderness, no masses palpated. No hepatosplenomegaly. Bowel sounds positive.  Musculoskeletal: no clubbing / cyanosis. No joint deformity upper and lower extremities. Good ROM, no contractures. Normal muscle tone.  Skin: no rashes, lesions, ulcers. No induration Neurologic: CN 2-12 grossly intact. Sensation intact, DTR normal. Strength 5/5 in all 4.  Psychiatric: Normal judgment and insight. Alert and oriented x 3. Normal mood.    Labs on Admission: I have personally reviewed following labs and imaging studies  CBC: Recent Labs  Lab 03/05/20 1948  03/08/20 1311  WBC 9.3 9.7  NEUTROABS  --  8.1*  HGB 12.4 13.9  HCT 38.6 42.5  MCV 87.9 85.9  PLT 426* 962*   Basic Metabolic Panel: Recent Labs  Lab 03/05/20 1948 03/08/20 1311  NA 134* 138  K 3.9 3.9  CL 103 103  CO2 23 23  GLUCOSE 97 107*  BUN 8 7  CREATININE 0.88 0.85  CALCIUM 9.2 9.3   GFR: Estimated Creatinine Clearance: 103.8 mL/min (by C-G formula based on SCr of 0.85 mg/dL). Liver Function Tests: Recent Labs  Lab 03/05/20 1948 03/08/20 1311  AST 27 127*  ALT 32 147*  ALKPHOS 91 141*  BILITOT 0.6 0.6  PROT 7.7 8.5*  ALBUMIN 4.2 4.2   Recent Labs  Lab 03/08/20 1311  LIPASE 30   No results for input(s): AMMONIA in the last 168 hours. Coagulation Profile: No results  for input(s): INR, PROTIME in the last 168 hours. Cardiac Enzymes: No results for input(s): CKTOTAL, CKMB, CKMBINDEX, TROPONINI in the last 168 hours. BNP (last 3 results) No results for input(s): PROBNP in the last 8760 hours. HbA1C: No results for input(s): HGBA1C in the last 72 hours. CBG: Recent Labs  Lab 03/05/20 2028  GLUCAP 86   Lipid Profile: No results for input(s): CHOL, HDL, LDLCALC, TRIG, CHOLHDL, LDLDIRECT in the last 72 hours. Thyroid Function Tests: Recent Labs    03/08/20 1311  TSH 1.858   Anemia Panel: No results for input(s): VITAMINB12, FOLATE, FERRITIN, TIBC, IRON, RETICCTPCT in the last 72 hours. Urine analysis:    Component Value Date/Time   COLORURINE YELLOW 03/08/2020 1540   APPEARANCEUR CLEAR 03/08/2020 1540   LABSPEC 1.014 03/08/2020 1540   PHURINE 5.0 03/08/2020 1540   GLUCOSEU NEGATIVE 03/08/2020 1540   HGBUR NEGATIVE 03/08/2020 1540   BILIRUBINUR NEGATIVE 03/08/2020 1540   KETONESUR 5 (A) 03/08/2020 1540   PROTEINUR NEGATIVE 03/08/2020 1540   NITRITE NEGATIVE 03/08/2020 1540   LEUKOCYTESUR NEGATIVE 03/08/2020 1540    Radiological Exams on Admission: DG Chest 1 View  Result Date: 03/08/2020 CLINICAL DATA:  Fever, body aches and  tachycardia for 3 days. EXAM: CHEST  1 VIEW COMPARISON:  03/05/2020 chest radiograph FINDINGS: The cardiomediastinal silhouette is unremarkable. There is no evidence of focal airspace disease, pulmonary edema, suspicious pulmonary nodule/mass, pleural effusion, or pneumothorax. No acute bony abnormalities are identified. A moderate apex RIGHT LOWER thoracic scoliosis is again noted. IMPRESSION: No active disease. Electronically Signed   By: Margarette Canada M.D.   On: 03/08/2020 14:12   US Abdomen Limited RUQ (LIVER/GB)  Result Date: 03/08/2020 CLINICAL DATA:  Elevated LFTs EXAM: ULTRASOUND ABDOMEN LIMITED RIGHT UPPER QUADRANT COMPARISON:  03/05/2020 FINDINGS: Gallbladder: No gallstones or wall thickening visualized. No sonographic Murphy sign noted by sonographer. Common bile duct: Diameter: 2 mm Liver: No focal lesion identified. Within normal limits in parenchymal echogenicity. Portal vein is patent on color Doppler imaging with normal direction of blood flow towards the liver. Other: None. IMPRESSION: Unremarkable exam Electronically Signed   By: Constance Holster M.D.   On: 03/08/2020 15:27    EKG: Independently reviewed.  Sinus tachycardia with borderline T wave changes  Assessment/Plan Principal Problem:   Severe sepsis (HCC) Active Problems:   ADD (attention deficit disorder) without hyperactivity   Ulcerative colitis, acute (HCC)   Elevated LFTs   Severe sepsis likely secondary to acute flareup of ulcerative colitis/elevated LFTs: Patient meets criteria for severe sepsis based on tachycardia, tachypnea and lactic acid of 10.  At this point in time, top differential diagnosis includes acute ulcerative colitis flareup however acute viral hepatitis is also one of the differential diagnosis.  C. difficile colitis is also possible cause.  She received Rocephin and Flagyl in the ED.  I will continue both of these medications, follow pending labs.  Check C. difficile.  Acute viral hepatitis panel  pending.  Repeat CBC and CMP in the morning.  Repeat lactic acid in few hours.  She received IV fluids per sepsis protocol.  We will continue on normal saline at 125 cc/h.  Provide symptomatic treatment with antiemetics.Follow-up on COVID-19 test, UA and ethanol.  We will also check UDS and will repeat CT abdomen and pelvis with IV contrast.  I will also give her 1 dose of IV Solu-Medrol 125 mg.  Follow blood culture.  I have consulted GI and discussed case with Dr. Watt Climes who will see  tomorrow.  Please note that I have been informed by patient's primary nurse in the ED that patient is repeat lactic acid is 10 however the result is not available for me to review personally.  DVT prophylaxis: enoxaparin (LOVENOX) injection 40 mg Start: 03/08/20 1715 Code Status: Full code Family Communication: None present at bedside.  Plan of care discussed with patient in length and he verbalized understanding and agreed with it. Disposition Plan: Likely home in next 2 to 3 days Consults called: [facility] Admission status: Inpatient   Darliss Cheney MD [facility]  03/08/2020, 5:09 PM  To contact the attending provider between 7A-7P or the covering provider during after hours 7P-7A, please log into the web site [email or web address]

## 2020-03-08 NOTE — ED Notes (Signed)
Critical value- lactic acid 10.2

## 2020-03-09 ENCOUNTER — Inpatient Hospital Stay (HOSPITAL_COMMUNITY): Payer: Commercial Managed Care - PPO

## 2020-03-09 DIAGNOSIS — A419 Sepsis, unspecified organism: Secondary | ICD-10-CM | POA: Diagnosis not present

## 2020-03-09 DIAGNOSIS — R6520 Severe sepsis without septic shock: Secondary | ICD-10-CM | POA: Diagnosis not present

## 2020-03-09 LAB — COMPREHENSIVE METABOLIC PANEL
ALT: 124 U/L — ABNORMAL HIGH (ref 0–44)
AST: 81 U/L — ABNORMAL HIGH (ref 15–41)
Albumin: 3.2 g/dL — ABNORMAL LOW (ref 3.5–5.0)
Alkaline Phosphatase: 111 U/L (ref 38–126)
Anion gap: 7 (ref 5–15)
BUN: 6 mg/dL (ref 6–20)
CO2: 25 mmol/L (ref 22–32)
Calcium: 8.5 mg/dL — ABNORMAL LOW (ref 8.9–10.3)
Chloride: 105 mmol/L (ref 98–111)
Creatinine, Ser: 0.78 mg/dL (ref 0.44–1.00)
GFR, Estimated: >60 mL/min (ref >60)
Glucose, Bld: 156 mg/dL — ABNORMAL HIGH (ref 70–99)
Potassium: 4.6 mmol/L (ref 3.5–5.1)
Sodium: 137 mmol/L (ref 135–145)
Total Bilirubin: 0.4 mg/dL (ref 0.3–1.2)
Total Protein: 6.8 g/dL (ref 6.5–8.1)

## 2020-03-09 LAB — CBC WITH DIFFERENTIAL/PLATELET
Abs Immature Granulocytes: 0.01 10*3/uL (ref 0.00–0.07)
Basophils Absolute: 0 10*3/uL (ref 0.0–0.1)
Basophils Relative: 0 %
Eosinophils Absolute: 0 10*3/uL (ref 0.0–0.5)
Eosinophils Relative: 0 %
HCT: 34.8 % — ABNORMAL LOW (ref 36.0–46.0)
Hemoglobin: 11.4 g/dL — ABNORMAL LOW (ref 12.0–15.0)
Immature Granulocytes: 0 %
Lymphocytes Relative: 7 %
Lymphs Abs: 0.7 10*3/uL (ref 0.7–4.0)
MCH: 28.4 pg (ref 26.0–34.0)
MCHC: 32.8 g/dL (ref 30.0–36.0)
MCV: 86.8 fL (ref 80.0–100.0)
Monocytes Absolute: 0.2 10*3/uL (ref 0.1–1.0)
Monocytes Relative: 3 %
Neutro Abs: 8.7 10*3/uL — ABNORMAL HIGH (ref 1.7–7.7)
Neutrophils Relative %: 90 %
Platelets: 393 10*3/uL (ref 150–400)
RBC: 4.01 MIL/uL (ref 3.87–5.11)
RDW: 14.6 % (ref 11.5–15.5)
WBC: 9.7 10*3/uL (ref 4.0–10.5)
nRBC: 0 % (ref 0.0–0.2)

## 2020-03-09 LAB — HEPATITIS PANEL, ACUTE
HCV Ab: NONREACTIVE
Hep A IgM: NONREACTIVE
Hep B C IgM: NONREACTIVE
Hepatitis B Surface Ag: NONREACTIVE

## 2020-03-09 LAB — SEDIMENTATION RATE: Sed Rate: 22 mm/hr (ref 0–22)

## 2020-03-09 LAB — CORTISOL-AM, BLOOD: Cortisol - AM: 6.6 ug/dL — ABNORMAL LOW (ref 6.7–22.6)

## 2020-03-09 LAB — PROTIME-INR
INR: 1.3 — ABNORMAL HIGH (ref 0.8–1.2)
Prothrombin Time: 16 seconds — ABNORMAL HIGH (ref 11.4–15.2)

## 2020-03-09 LAB — PROCALCITONIN: Procalcitonin: 0.12 ng/mL

## 2020-03-09 LAB — HIV ANTIBODY (ROUTINE TESTING W REFLEX): HIV Screen 4th Generation wRfx: NONREACTIVE

## 2020-03-09 MED ORDER — METHYLPREDNISOLONE SODIUM SUCC 40 MG IJ SOLR
40.0000 mg | Freq: Two times a day (BID) | INTRAMUSCULAR | Status: DC
  Administered 2020-03-09 – 2020-03-10 (×3): 40 mg via INTRAVENOUS
  Filled 2020-03-09 (×3): qty 1

## 2020-03-09 MED ORDER — LORAZEPAM 2 MG/ML IJ SOLN
1.0000 mg | Freq: Once | INTRAMUSCULAR | Status: AC
  Administered 2020-03-09: 1 mg via INTRAVENOUS
  Filled 2020-03-09: qty 1

## 2020-03-09 MED ORDER — GADOBUTROL 1 MMOL/ML IV SOLN
7.0000 mL | Freq: Once | INTRAVENOUS | Status: AC | PRN
  Administered 2020-03-09: 7 mL via INTRAVENOUS

## 2020-03-09 NOTE — Progress Notes (Signed)
PROGRESS NOTE    Laura Vaughn  XBL:[id number] DOB: 12-31-1995 DOA: 03/08/2020 PCP: Patient, No Pcp Per   Brief Narrative:  HPI: Laura Vaughn is a 25 y.o. female with medical history significant of ADHD and ulcerative colitis presented to ED with multiple complaints including abdominal pain, nausea, vomiting and fever.  According to patient, she initially went to [facility] on 03/05/2020 with major complaint of generalized weakness and some nausea.  Upon arrival to ED, she was febrile as well.  Abdominal CT was done which showed pancolitis consistent with her history of ulcerative colitis.  She felt better and there was no acute finding found so she was discharged home.  According to patient, she was not feeling that well even at the time of discharge however a day after discharge, she started having abdominal pain located at the periumbilical as well as epigastric region and then also started having fever with T-max of 101.3 at home.  The pain has been intermittent, aggravated with movement and eating and relieved with rest.  Ranges anywhere from 3-6 out of 10.  She has intermittent nausea and vomiting along with that and feels generalized weakness.  She claims that she has been losing weight gradually in last few months.  She used to be on some medications for ulcerative colitis but she stopped taking them about 6 months ago and her last visit to her GI at [facility] was about a year ago.  She has been trying to manage her ulcerative colitis by herself by ingesting/modifying her diet.  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  Patient also has noticed tachycardia at home with rates around 170 through her Fitbit device.  Patient denies eating or drinking anything outside other than what ever she cooks at home.  There is no other sick contact, all the family members including 2 kids and husband are doing fine without  any symptoms.  No recent travel.  She also tells me that on a regular day, she has approximately 10 watery bowel movements but last since 2 to 3 days, she has been having about 15 to 20 BMs /day.  ED Course: Upon arrival to ED, patient was once again febrile with 101, tachycardic at 151 and tachypneic.  CBC with normal white cells, CMP showed normal renal function but elevated LFTs and alkaline phosphatase.  Due to this abdominal ultrasound was done which is unremarkable.  Chest x-ray unremarkable as well.  Multiple labs are still pending including COVID-19, ethanol, UA, acute viral hepatitis panel.  Initial lactic acid was normal.  Repeat lactic acid is 10.  C. difficile is pending.  Assessment & Plan:   Principal Problem:   Severe sepsis ([place]) Active Problems:   ADD (attention deficit disorder) without hyperactivity   Ulcerative colitis, acute ([facility])   Elevated LFTs  Sepsis secondary to acute flareup of ulcerative colitis/acute ascending cholangitis/elevated LFTs: Patient met sepsis criteria based on tachycardia, tachypnea.  Her initial lactic acid was normal but repeat was 10 which was erroneous and we repeated again which was 1 again.  So she does not qualify for severe sepsis.  CT abdomen shows biliary duct dilatation raises suspicious for acute cholangitis.  Will order MRCP for that.  Continue Rocephin and Flagyl.  Patient has not had any fever since yesterday.  Her abdominal pain has improved and her tenderness is improved as well.  Continues to have diarrhea.  C.  difficile negative.  GI pathogen panel pending.  Acute viral hepatitis panel is negative as well.  LFTs improved compared to yesterday.  UDS negative.  To be seen by GI.  Multiple labs ordered by GI.    DVT prophylaxis: enoxaparin (LOVENOX) injection 40 mg Start: 03/08/20 2200   Code Status: Full Code  Family Communication:  None present at bedside.  Plan of care discussed with patient in length and he verbalized understanding and  agreed with it.  Status is: Inpatient  Remains inpatient appropriate because:Ongoing diagnostic testing needed not appropriate for outpatient work up   Dispo: The patient is from: Home              Anticipated d/c is to: Home              Anticipated d/c date is: 1 day              Patient currently is not medically stable to d/c.   Difficult to place patient No        Estimated body mass index is 24.81 kg/m as calculated from the following:   Height as of this encounter: 5' 8"  (1.727 m).   Weight as of this encounter: 74 kg.      Nutritional status:               Consultants:   GI  Procedures:   None  Antimicrobials:  Anti-infectives (From admission, onward)   Start     Dose/Rate Route Frequency Ordered Stop   03/09/20 1400  cefTRIAXone (ROCEPHIN) 2 g in sodium chloride 0.9 % 100 mL IVPB        2 g 200 mL/hr over 30 Minutes Intravenous Every 24 hours 03/08/20 1707     03/08/20 2200  metroNIDAZOLE (FLAGYL) IVPB 500 mg        500 mg 100 mL/hr over 60 Minutes Intravenous Every 8 hours 03/08/20 1707     03/08/20 1315  cefTRIAXone (ROCEPHIN) 2 g in sodium chloride 0.9 % 100 mL IVPB        2 g 200 mL/hr over 30 Minutes Intravenous  Once 03/08/20 1313 03/08/20 1529   03/08/20 1315  metroNIDAZOLE (FLAGYL) IVPB 500 mg        500 mg 100 mL/hr over 60 Minutes Intravenous  Once 03/08/20 1313 03/08/20 1527         Subjective: Patient seen and examined.  She feels much better than yesterday.  No more abdominal pain or vomiting but she still has nausea and diarrhea.  Objective: Vitals:   03/09/20 0048 03/09/20 0430 03/09/20 0500 03/09/20 0819  BP: 96/72 97/70  105/72  Pulse: 95 63  68  Resp: 16 16  15   Temp: 97.6 F (36.4 C) 97.7 F (36.5 C)  98.3 F (36.8 C)  TempSrc: Oral Oral    SpO2: 99% 98%  99%  Weight:   74 kg   Height:   5' 8"  (1.727 m)     Intake/Output Summary (Last 24 hours) at 03/09/2020 1025 Last data filed at 03/09/2020 0300 Gross  per 24 hour  Intake 4162.42 ml  Output --  Net 4162.42 ml   Filed Weights   03/09/20 0500  Weight: 74 kg    Examination:  General exam: Appears calm and comfortable  Respiratory system: Clear to auscultation. Respiratory effort normal. Cardiovascular system: S1 & S2 heard, RRR. No JVD, murmurs, rubs, gallops or clicks. No pedal edema. Gastrointestinal system: Abdomen is nondistended, soft and minimal epigastric  tenderness. No organomegaly or masses felt. Normal bowel sounds heard. Central nervous system: Alert and oriented. No focal neurological deficits. Extremities: Symmetric 5 x 5 power. Skin: No rashes, lesions or ulcers Psychiatry: Judgement and insight appear normal. Mood & affect appropriate.    Data Reviewed: I have personally reviewed following labs and imaging studies  CBC: Recent Labs  Lab 03/05/20 1948 03/08/20 1311 03/08/20 1704 03/09/20 0810  WBC 9.3 9.7 8.1 9.7  NEUTROABS  --  8.1*  --  8.7*  HGB 12.4 13.9 11.5* 11.4*  HCT 38.6 42.5 35.1* 34.8*  MCV 87.9 85.9 86.5 86.8  PLT 426* 437* 362 656   Basic Metabolic Panel: Recent Labs  Lab 03/05/20 1948 03/08/20 1311 03/08/20 1704 03/09/20 0301  NA 134* 138  --  137  K 3.9 3.9  --  4.6  CL 103 103  --  105  CO2 23 23  --  25  GLUCOSE 97 107*  --  156*  BUN 8 7  --  6  CREATININE 0.88 0.85 0.81 0.78  CALCIUM 9.2 9.3  --  8.5*  MG  --   --  1.5*  --    GFR: Estimated Creatinine Clearance: 110.3 mL/min (by C-G formula based on SCr of 0.78 mg/dL). Liver Function Tests: Recent Labs  Lab 03/05/20 1948 03/08/20 1311 03/09/20 0301  AST 27 127* 81*  ALT 32 147* 124*  ALKPHOS 91 141* 111  BILITOT 0.6 0.6 0.4  PROT 7.7 8.5* 6.8  ALBUMIN 4.2 4.2 3.2*   Recent Labs  Lab 03/08/20 1311  LIPASE 30   No results for input(s): AMMONIA in the last 168 hours. Coagulation Profile: Recent Labs  Lab 03/08/20 2200 03/09/20 0301  INR 1.2 1.3*   Cardiac Enzymes: No results for input(s): CKTOTAL, CKMB,  CKMBINDEX, TROPONINI in the last 168 hours. BNP (last 3 results) No results for input(s): PROBNP in the last 8760 hours. HbA1C: No results for input(s): HGBA1C in the last 72 hours. CBG: Recent Labs  Lab 03/05/20 2028  GLUCAP 86   Lipid Profile: No results for input(s): CHOL, HDL, LDLCALC, TRIG, CHOLHDL, LDLDIRECT in the last 72 hours. Thyroid Function Tests: Recent Labs    03/08/20 1311  TSH 1.858   Anemia Panel: No results for input(s): VITAMINB12, FOLATE, FERRITIN, TIBC, IRON, RETICCTPCT in the last 72 hours. Sepsis Labs: Recent Labs  Lab 03/08/20 1540 03/08/20 1734 03/08/20 1900 03/08/20 2202 03/09/20 0301  PROCALCITON  --   --   --   --  0.12  LATICACIDVEN 10.2* 1.0 0.8 0.7  --     Recent Results (from the past 240 hour(s))  SARS CORONAVIRUS 2 (TAT 6-24 HRS) Nasopharyngeal Nasopharyngeal Swab     Status: None   Collection Time: 03/05/20 10:24 PM   Specimen: Nasopharyngeal Swab  Result Value Ref Range Status   SARS Coronavirus 2 NEGATIVE NEGATIVE Final    Comment: (NOTE) SARS-CoV-2 target nucleic acids are NOT DETECTED.  The SARS-CoV-2 RNA is generally detectable in upper and lower respiratory specimens during the acute phase of infection. Negative results do not preclude SARS-CoV-2 infection, do not rule out co-infections with other pathogens, and should not be used as the sole basis for treatment or other patient management decisions. Negative results must be combined with clinical observations, patient history, and epidemiological information. The expected result is Negative.  Fact Sheet for Patients: SugarRoll.be  Fact Sheet for Healthcare Providers: https://www.woods-mathews.com/  This test is not yet approved or cleared by the [place]  FDA and  has been authorized for detection and/or diagnosis of SARS-CoV-2 by FDA under an Emergency Use Authorization (EUA). This EUA will remain  in effect (meaning this  test can be used) for the duration of the COVID-19 declaration under Se ction 564(b)(1) of the Act, 21 U.S.C. section 360bbb-3(b)(1), unless the authorization is terminated or revoked sooner.  Performed at [facility], [place] [address], [place], [place] [address]   Blood culture (routine x 2)     Status: None (Preliminary result)   Collection Time: 03/08/20  1:11 PM   Specimen: BLOOD  Result Value Ref Range Status   Specimen Description   Final    BLOOD RIGHT ANTECUBITAL Performed at [facility] [address] [place], [place] [address]    Special Requests   Final    BOTTLES DRAWN AEROBIC AND ANAEROBIC Blood Culture adequate volume Performed at [facility] [address], [place], [place] [address]    Culture   Final    NO GROWTH < 24 HOURS Performed at [facility] [address], [place], [place] [address]    Report Status PENDING  Incomplete  Blood culture (routine x 2)     Status: None (Preliminary result)   Collection Time: 03/08/20  1:13 PM   Specimen: BLOOD RIGHT HAND  Result Value Ref Range Status   Specimen Description   Final    BLOOD RIGHT HAND Performed at [facility], [place] [address], [place], [place] [address]    Special Requests   Final    BOTTLES DRAWN AEROBIC AND ANAEROBIC Blood Culture adequate volume Performed at [facility] [address], [place], [place] [address]    Culture   Final    NO GROWTH < 24 HOURS Performed at [facility] [address], [place], [place] [address]    Report Status PENDING  Incomplete  Resp Panel by RT-PCR (Flu A&B, Covid) Nasopharyngeal Swab     Status: None   Collection Time: 03/08/20  3:40 PM   Specimen: Nasopharyngeal Swab; Nasopharyngeal(NP) swabs in vial transport medium  Result Value Ref Range Status   SARS Coronavirus 2 by RT PCR NEGATIVE NEGATIVE Final    Comment: (NOTE) SARS-CoV-2 target nucleic acids are NOT  DETECTED.  The SARS-CoV-2 RNA is generally detectable in upper respiratory specimens during the acute phase of infection. The lowest concentration of SARS-CoV-2 viral copies this assay can detect is 138 copies/mL. A negative result does not preclude SARS-Cov-2 infection and should not be used as the sole basis for treatment or other patient management decisions. A negative result may occur with  improper specimen collection/handling, submission of specimen other than nasopharyngeal swab, presence of viral mutation(s) within the areas targeted by this assay, and inadequate number of viral copies(<138 copies/mL). A negative result must be combined with clinical observations, patient history, and epidemiological information. The expected result is Negative.  Fact Sheet for Patients:  EntrepreneurPulse.com.au  Fact Sheet for Healthcare Providers:  IncredibleEmployment.be  This test is no t yet approved or cleared by the [place] FDA and  has been authorized for detection and/or diagnosis of SARS-CoV-2 by FDA under an Emergency Use Authorization (EUA). This EUA will remain  in effect (meaning this test can be used) for the duration of the COVID-19 declaration under Section 564(b)(1) of the Act, 21 U.S.C.section 360bbb-3(b)(1), unless the authorization is terminated  or revoked sooner.       Influenza A  by PCR NEGATIVE NEGATIVE Final   Influenza B by PCR NEGATIVE NEGATIVE Final    Comment: (NOTE) The Xpert Xpress SARS-CoV-2/FLU/RSV plus assay is intended as an aid in the diagnosis of influenza from Nasopharyngeal swab specimens and should not be used as a sole basis for treatment. Nasal washings and aspirates are unacceptable for Xpert Xpress SARS-CoV-2/FLU/RSV testing.  Fact Sheet for Patients: EntrepreneurPulse.com.au  Fact Sheet for Healthcare Providers: IncredibleEmployment.be  This test is not yet  approved or cleared by the [place] FDA and has been authorized for detection and/or diagnosis of SARS-CoV-2 by FDA under an Emergency Use Authorization (EUA). This EUA will remain in effect (meaning this test can be used) for the duration of the COVID-19 declaration under Section 564(b)(1) of the Act, 21 U.S.C. section 360bbb-3(b)(1), unless the authorization is terminated or revoked.  Performed at [facility], [place] [address] [place], [place] [address]   C Difficile Quick Screen w PCR reflex     Status: None   Collection Time: 03/08/20  5:30 PM   Specimen: STOOL  Result Value Ref Range Status   C Diff antigen NEGATIVE NEGATIVE Final   C Diff toxin NEGATIVE NEGATIVE Final   C Diff interpretation No C. difficile detected.  Final    Comment: Performed at [facility], [place] [address], [place], [place] [address]      Radiology Studies: DG Chest 1 View  Result Date: 03/08/2020 CLINICAL DATA:  Fever, body aches and tachycardia for 3 days. EXAM: CHEST  1 VIEW COMPARISON:  03/05/2020 chest radiograph FINDINGS: The cardiomediastinal silhouette is unremarkable. There is no evidence of focal airspace disease, pulmonary edema, suspicious pulmonary nodule/mass, pleural effusion, or pneumothorax. No acute bony abnormalities are identified. A moderate apex RIGHT LOWER thoracic scoliosis is again noted. IMPRESSION: No active disease. Electronically Signed   By: Margarette Canada M.D.   On: 03/08/2020 14:12   CT ABDOMEN PELVIS W CONTRAST  Result Date: 03/08/2020 CLINICAL DATA:  Sec sepsis. Colostomy reversal and mid abdomen pain. Fever. History of ulcerative colitis. EXAM: CT ABDOMEN AND PELVIS WITH CONTRAST TECHNIQUE: Multidetector CT imaging of the abdomen and pelvis was performed using the standard protocol following bolus administration of intravenous contrast. CONTRAST:  181m OMNIPAQUE IOHEXOL 300 MG/ML  SOLN COMPARISON:  03/05/2020 FINDINGS: Lower chest: The  lung bases are clear. The heart size is normal. Hepatobiliary: The liver is normal. Normal gallbladder.There is suggestion of mild intrahepatic biliary ductal dilatation. Pancreas: Normal contours without ductal dilatation. No peripancreatic fluid collection. Spleen: Unremarkable. Adrenals/Urinary Tract: --Adrenal glands: Unremarkable. --Right kidney/ureter: No hydronephrosis or radiopaque kidney stones. --Left kidney/ureter: No hydronephrosis or radiopaque kidney stones. --Urinary bladder: Unremarkable. Stomach/Bowel: --Stomach/Duodenum: No hiatal hernia or other gastric abnormality. Normal duodenal course and caliber. --Small bowel: Unremarkable. --Colon: Again noted are findings consistent with pancolitis. The circumferential wall thickening of the colon has slightly improved from prior study. Liquid stool remains throughout the colon. --Appendix: Normal. Vascular/Lymphatic: Normal course and caliber of the major abdominal vessels. --No retroperitoneal lymphadenopathy. --mildly enlarged mesenteric lymph nodes are noted, especially in the right lower quadrant. --No pelvic or inguinal lymphadenopathy. Reproductive: Unremarkable Other: No ascites or free air. The abdominal wall is normal. Musculoskeletal. 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 IMPRESSION: 1. Again noted are findings of pancolitis consistent with the patient's history of ulcerative colitis. This appears to have improved slightly from recent prior study. 2. Reactive mesenteric lymph nodes are again noted. 3. Possible mild intrahepatic  biliary ductal dilatation. 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 This finding would be better evaluated by MRI. Electronically Signed   By: Constance Holster M.D.   On: 03/08/2020 20:02   US Abdomen Limited RUQ (LIVER/GB)  Result Date: 03/08/2020 CLINICAL DATA:  Elevated LFTs EXAM: ULTRASOUND ABDOMEN LIMITED  RIGHT UPPER QUADRANT COMPARISON:  03/05/2020 FINDINGS: Gallbladder: No gallstones or wall thickening visualized. No sonographic Murphy sign noted by sonographer. Common bile duct: Diameter: 2 mm Liver: No focal lesion identified. Within normal limits in parenchymal echogenicity. Portal vein is patent on color Doppler imaging with normal direction of blood flow towards the liver. Other: None. IMPRESSION: Unremarkable exam Electronically Signed   By: Constance Holster M.D.   On: 03/08/2020 15:27    Scheduled Meds: . enoxaparin (LOVENOX) injection  40 mg Subcutaneous Q24H  . venlafaxine XR  75 mg Oral Q breakfast   Continuous Infusions: . 0.9 % NaCl with KCl 20 mEq / L 125 mL/hr at 03/09/20 0457  . cefTRIAXone (ROCEPHIN)  IV    . metronidazole 500 mg (03/09/20 0500)     LOS: 1 day   Time spent: 29 minutes   Darliss Cheney, MD [facility]  03/09/2020, 10:25 AM   To contact the attending provider between 7A-7P or the covering provider during after hours 7P-7A, please log into the web site [email or web address].

## 2020-03-09 NOTE — Consult Note (Signed)
Referring Provider: Dr. Darliss Cheney [facility]) Primary Care Physician:  Patient, No Pcp Per Primary Gastroenterologist:  Althia Forts ([place], [place]ondall Allegra)  Reason for Consultation:  Ulcerative Colitis flare, elevated LFTs  HPI: Laura Vaughn is a 25 y.o. female with history of ulcerative colitis and necrotizing fasciitis after vaginal delivery presenting for consultation of ulcerative colitis flare.  Patient states she has been off all ulcerative colitis medications for the past 6 months and has been having approximately 10 bowel movements per day.  However, over the last 2 or 3 days, she started having 20+ bowel movements per day.  She denies any rectal bleeding.   She denies recent travel, sick contacts, or any contact with suspicious food.  She has lower abdominal pain, as well as periumbilical abdominal pain radiating to her epigastrium.  She has intermittent nausea and vomiting, typically worse after eating.  She reports 30 lb weight loss over the last 3 months.  Denies dysphagia or GERD.  She has been having intermittent fevers for the last 2-3 days, which resolve quickly without intervention. Denies skin rashes, oral lesions, or joint pain.  Patient states she was initially diagnosed with ulcerative colitis in 2018.  She was on Humira with some relief and having 3-5 BMs per day but states that she was still symptomatic.  She had 1 dose of Entyvio, but developed C. difficile within days, so that was discontinued.  She was previously on Lialda as well.  Denies family history of ulcerative colitis or Crohn's disease.   Past Medical History:  Diagnosis Date  . ADHD (attention deficit hyperactivity disorder)   . Anemia   . History of necrotizing fasciitis    vulvar and vaginal after last delivery  . Ulcerative colitis [facility])     Past Surgical History:  Procedure Laterality Date  . ABDOMINAL DEBRIDEMENT  2018   x7  . CESAREAN SECTION N/A 06/28/2019   Procedure: CESAREAN  SECTION;  Surgeon: Cheri Fowler, MD;  Location: [facility] LD ORS;  Service: Obstetrics;  Laterality: N/A;  request RNFA  . COLON SURGERY    . OSTOMY  2018  . ostomy reversal  2020  . VAGINA SURGERY      Prior to Admission medications   Medication Sig Start Date End Date Taking? Authorizing Provider  hydrOXYzine (ATARAX/VISTARIL) 10 MG tablet Take 10 mg by mouth 3 (three) times daily as needed for anxiety. 02/20/20  Yes [provider]  traZODone (DESYREL) 100 MG tablet Take 100 mg by mouth at bedtime. 02/29/20  Yes [provider]  venlafaxine XR (EFFEXOR-XR) 75 MG 24 hr capsule Take 75 mg by mouth daily. 02/29/20  Yes [provider]    Scheduled Meds: . enoxaparin (LOVENOX) injection  40 mg Subcutaneous Q24H  . venlafaxine XR  75 mg Oral Q breakfast   Continuous Infusions: . 0.9 % NaCl with KCl 20 mEq / L 125 mL/hr at 03/09/20 0457  . cefTRIAXone (ROCEPHIN)  IV    . lactated ringers Stopped (03/08/20 2100)  . metronidazole 500 mg (03/09/20 0500)   PRN Meds:.acetaminophen **OR** acetaminophen, ondansetron **OR** ondansetron (ZOFRAN) IV, oxyCODONE  Allergies as of 03/08/2020  . (No Known Allergies)    Family History  Problem Relation Age of Onset  . ADD / ADHD Sister   . Bipolar disorder Paternal Grandmother   . ADD / ADHD Sister   . Diabetes Father     Social History   Socioeconomic History  . Marital status: Married    Spouse  name: Not on file  . Number of children: Not on file  . Years of education: Not on file  . Highest education level: Not on file  Occupational History  . Not on file  Tobacco Use  . Smoking status: Never Smoker  . Smokeless tobacco: Never Used  Vaping Use  . Vaping Use: Never used  Substance and Sexual Activity  . Alcohol use: Never  . Drug use: Never  . Sexual activity: Yes  Other Topics Concern  . Not on file  Social History Narrative  . Not on file   Social Determinants of Health   Financial Resource Strain:  Not on file  Food Insecurity: Not on file  Transportation Needs: Not on file  Physical Activity: Not on file  Stress: Not on file  Social Connections: Not on file  Intimate Partner Violence: Not on file    Review of Systems: Review of Systems  Constitutional: Positive for fever and weight loss.  HENT: Negative for hearing loss and tinnitus.   Eyes: Negative for pain and redness.  Cardiovascular: Negative for chest pain and palpitations.  Gastrointestinal: Positive for abdominal pain, diarrhea, nausea and vomiting. Negative for blood in stool, constipation, heartburn and melena.  Genitourinary: Negative for flank pain and hematuria.  Musculoskeletal: Negative for joint pain and neck pain.  Skin: Negative for itching and rash.  Neurological: Negative for seizures and loss of consciousness.  Endo/Heme/Allergies: Negative for polydipsia. Does not bruise/bleed easily.  Psychiatric/Behavioral: Negative for substance abuse. The patient is not nervous/anxious.     Physical Exam: Vital signs: Vitals:   03/09/20 0430 03/09/20 0819  BP: 97/70 105/72  Pulse: 63 68  Resp: 16 15  Temp: 97.7 F (36.5 C) 98.3 F (36.8 C)  SpO2: 98% 99%   Last BM Date: 03/08/20  Physical Exam Vitals reviewed.  Constitutional:      General: She is not in acute distress. HENT:     Head: Normocephalic and atraumatic.     Nose: Nose normal. No congestion.     Mouth/Throat:     Mouth: Mucous membranes are moist.     Pharynx: Oropharynx is clear.  Eyes:     General: No scleral icterus.    Extraocular Movements: Extraocular movements intact.     Conjunctiva/sclera: Conjunctivae normal.  Cardiovascular:     Rate and Rhythm: Normal rate and regular rhythm.     Pulses: Normal pulses.  Pulmonary:     Effort: Pulmonary effort is normal.     Breath sounds: Normal breath sounds.  Abdominal:     General: Bowel sounds are normal. There is no distension.     Palpations: Abdomen is soft. There is no mass.      Tenderness: There is abdominal tenderness (diffuse). There is no guarding or rebound.     Hernia: No hernia is present.  Musculoskeletal:        General: No swelling or tenderness.     Cervical back: Normal range of motion and neck supple.  Neurological:     General: No focal deficit present.     Mental Status: She is alert and oriented to person, place, and time.  Psychiatric:        Mood and Affect: Mood normal.        Behavior: Behavior normal. Behavior is cooperative.      GI:  Lab Results: Recent Labs    03/08/20 1311 03/08/20 1704  WBC 9.7 8.1  HGB 13.9 11.5*  HCT 42.5 35.1*  PLT  [place]    03/08/20 1311 03/08/20 1704 03/09/20 0301  NA 138  --  137  K 3.9  --  4.6  CL 103  --  105  CO2 23  --  25  GLUCOSE 107*  --  156*  BUN 7  --  6  CREATININE 0.85 0.81 0.78  CALCIUM 9.3  --  8.5*   LFT Recent Labs    03/09/20 0301  PROT 6.8  ALBUMIN 3.2*  AST 81*  ALT 124*  ALKPHOS 111  BILITOT 0.4   PT/INR Recent Labs    03/08/20 2200 03/09/20 0301  LABPROT 15.2 16.0*  INR 1.2 1.3*     Studies/Results: DG Chest 1 View  Result Date: 03/08/2020 CLINICAL DATA:  Fever, body aches and tachycardia for 3 days. EXAM: CHEST  1 VIEW COMPARISON:  03/05/2020 chest radiograph FINDINGS: The cardiomediastinal silhouette is unremarkable. There is no evidence of focal airspace disease, pulmonary edema, suspicious pulmonary nodule/mass, pleural effusion, or pneumothorax. No acute bony abnormalities are identified. A moderate apex RIGHT LOWER thoracic scoliosis is again noted. IMPRESSION: No active disease. Electronically Signed   By: Margarette Canada M.D.   On: 03/08/2020 14:12   CT ABDOMEN PELVIS W CONTRAST  Result Date: 03/08/2020 CLINICAL DATA:  Sec sepsis. Colostomy reversal and mid abdomen pain. Fever. History of ulcerative colitis. EXAM: CT ABDOMEN AND PELVIS WITH CONTRAST TECHNIQUE: Multidetector CT imaging of the abdomen and pelvis was performed  using the standard protocol following bolus administration of intravenous contrast. CONTRAST:  146m OMNIPAQUE IOHEXOL 300 MG/ML  SOLN COMPARISON:  03/05/2020 FINDINGS: Lower chest: The lung bases are clear. The heart size is normal. Hepatobiliary: The liver is normal. Normal gallbladder.There is suggestion of mild intrahepatic biliary ductal dilatation. Pancreas: Normal contours without ductal dilatation. No peripancreatic fluid collection. Spleen: Unremarkable. Adrenals/Urinary Tract: --Adrenal glands: Unremarkable. --Right kidney/ureter: No hydronephrosis or radiopaque kidney stones. --Left kidney/ureter: No hydronephrosis or radiopaque kidney stones. --Urinary bladder: Unremarkable. Stomach/Bowel: --Stomach/Duodenum: No hiatal hernia or other gastric abnormality. Normal duodenal course and caliber. --Small bowel: Unremarkable. --Colon: Again noted are findings consistent with pancolitis. The circumferential wall thickening of the colon has slightly improved from prior study. Liquid stool remains throughout the colon. --Appendix: Normal. Vascular/Lymphatic: Normal course and caliber of the major abdominal vessels. --No retroperitoneal lymphadenopathy. --mildly enlarged mesenteric lymph nodes are noted, especially in the right lower quadrant. --No pelvic or inguinal lymphadenopathy. Reproductive: Unremarkable Other: No ascites or free air. The abdominal wall is normal. Musculoskeletal. 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 IMPRESSION: 1. Again noted are findings of pancolitis consistent with the patient's history of ulcerative colitis. This appears to have improved slightly from recent prior study. 2. Reactive mesenteric lymph nodes are again noted. 3. Possible mild intrahepatic biliary ductal dilatation. 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 This finding would be better evaluated by MRI. Electronically  Signed   By: CConstance HolsterM.D.   On: 03/08/2020 20:02   [place]Abdomen Limited RUQ (LIVER/GB)  Result Date: 03/08/2020 CLINICAL DATA:  Elevated LFTs EXAM: ULTRASOUND ABDOMEN LIMITED RIGHT UPPER QUADRANT COMPARISON:  03/05/2020 FINDINGS: Gallbladder: No gallstones or wall thickening visualized. No sonographic Murphy sign noted by sonographer. Common bile duct: Diameter: 2 mm Liver: No focal lesion identified. Within normal limits in parenchymal echogenicity. Portal vein is patent on color Doppler imaging with normal direction of blood flow towards the liver. Other: None.  IMPRESSION: Unremarkable exam Electronically Signed   By: Constance Holster M.D.   On: 03/08/2020 15:27    Impression: Ulcerative Colitis flare: CT 2/21 revealed pancolitis, slightly improved from CT 03/05/2020.  She has been off maintenance medications for at least 6 months.  -Hemoglobin 11.4, stable -C. difficile negative.  GI pathogen panel pending. -Patient's history of necrotizing fasciitis after vaginal delivery is concerning for Crohn's disease (Crohn's colitis)  Abnormal LFTs: CT 03/09/2020 revealed possible mild intrahepatic biliary ductal dilation, may represent PSC. -T bili 0.4/AST 81/ALT 124/ALP 111 -INR 1.3  Plan: Solu-Medrol 40 mg IV twice daily.  CRP, ESR, and fecal calprotectin ordered.  ANCA titers ordered as these may guide differentiation between UC and Crohn's colitis.   MRI/MRCP for further evaluation of biliary tree.  Continue to trend LFTs.  ASMA/AMA ordered.  Patient was advised that she will need long-term maintenance medication for ulcerative colitis.  Hepatitis B surface antigen and QuantiFERON gold were drawn in anticipation of the need for biologic as an outpatient.  [facility] will follow.   LOS: 1 day   Salley Slaughter  PA-C 03/09/2020, 8:42 AM  Contact #  [phone]

## 2020-03-10 DIAGNOSIS — A419 Sepsis, unspecified organism: Secondary | ICD-10-CM | POA: Diagnosis not present

## 2020-03-10 DIAGNOSIS — R6520 Severe sepsis without septic shock: Secondary | ICD-10-CM | POA: Diagnosis not present

## 2020-03-10 LAB — COMPREHENSIVE METABOLIC PANEL
ALT: 98 U/L — ABNORMAL HIGH (ref 0–44)
AST: 46 U/L — ABNORMAL HIGH (ref 15–41)
Albumin: 3.3 g/dL — ABNORMAL LOW (ref 3.5–5.0)
Alkaline Phosphatase: 105 U/L (ref 38–126)
Anion gap: 8 (ref 5–15)
BUN: 9 mg/dL (ref 6–20)
CO2: 23 mmol/L (ref 22–32)
Calcium: 8.6 mg/dL — ABNORMAL LOW (ref 8.9–10.3)
Chloride: 106 mmol/L (ref 98–111)
Creatinine, Ser: 0.72 mg/dL (ref 0.44–1.00)
GFR, Estimated: >60 mL/min (ref >60)
Glucose, Bld: 140 mg/dL — ABNORMAL HIGH (ref 70–99)
Potassium: 4.3 mmol/L (ref 3.5–5.1)
Sodium: 137 mmol/L (ref 135–145)
Total Bilirubin: 0.5 mg/dL (ref 0.3–1.2)
Total Protein: 6.9 g/dL (ref 6.5–8.1)

## 2020-03-10 LAB — ANTI-SMOOTH MUSCLE ANTIBODY, IGG: F-Actin IgG: 6 Units (ref 0–19)

## 2020-03-10 LAB — GASTROINTESTINAL PANEL BY PCR, STOOL (REPLACES STOOL CULTURE)

## 2020-03-10 LAB — MITOCHONDRIAL ANTIBODIES: Mitochondrial M2 Ab, IgG: <20 Units (ref 0.0–20.0)

## 2020-03-10 LAB — URINE CULTURE

## 2020-03-10 LAB — HEPATITIS B SURFACE ANTIGEN: Hepatitis B Surface Ag: NONREACTIVE

## 2020-03-10 LAB — HIGH SENSITIVITY CRP: CRP, High Sensitivity: 179.75 mg/L — ABNORMAL HIGH (ref 0.00–3.00)

## 2020-03-10 MED ORDER — PREDNISONE 10 MG PO TABS: ORAL_TABLET | ORAL | 0 refills | Status: DC

## 2020-03-10 NOTE — Progress Notes (Signed)
Subjective: Patient states that she did not have any bowel movement yesterday, however, had 1 loose bowel movement today morning. She has been able to tolerate full liquid diet without nausea, vomiting or abdominal pain. She states that she has been having random episodes of fever, however last documented T-max was 100.9 F on 03/08/2020 at 6:55 PM.  Objective: Vital signs in last 24 hours: Temp:  [97.7 F (36.5 C)-97.9 F (36.6 C)] 97.7 F (36.5 C) (02/22 0457) Pulse Rate:  [54-59] 54 (02/22 0457) Resp:  [18] 18 (02/22 0457) BP: (99-110)/(68-72) 99/68 (02/22 0457) SpO2:  [97 %-98 %] 97 % (02/22 0457) Weight change:  Last BM Date: 03/09/20  PE: Appears comfortable, moist mucous membranes, heart rate is regular GENERAL: Not in distress, able to speak in full sentences, mild pallor ABDOMEN: Soft, nondistended, nontender, normoactive bowel sounds EXTREMITIES: No deformity  Lab Results: Results for orders placed or performed during the hospital encounter of 03/08/20 (from the past 48 hour(s))  I-Stat Beta hCG blood, ED ([facility], [facility], [facility] only)     Status: None   Collection Time: 03/08/20  1:09 PM  Result Value Ref Range   I-stat hCG, quantitative <5.0 <5 mIU/mL   Comment 3            Comment:   GEST. AGE      CONC.  (mIU/mL)   <=1 WEEK        5 - 50     2 WEEKS       50 - 500     3 WEEKS       100 - 10,000     4 WEEKS     1,000 - 30,000        FEMALE AND NON-PREGNANT FEMALE:     LESS THAN 5 mIU/mL   CBC with Differential     Status: Abnormal   Collection Time: 03/08/20  1:11 PM  Result Value Ref Range   WBC 9.7 4.0 - 10.5 K/uL   RBC 4.95 3.87 - 5.11 MIL/uL   Hemoglobin 13.9 12.0 - 15.0 g/dL   HCT 42.5 36.0 - 46.0 %   MCV 85.9 80.0 - 100.0 fL   MCH 28.1 26.0 - 34.0 pg   MCHC 32.7 30.0 - 36.0 g/dL   RDW 14.4 11.5 - 15.5 %   Platelets 437 (H) 150 - 400 K/uL   nRBC 0.0 0.0 - 0.2 %   Neutrophils Relative % 83 %   Neutro Abs 8.1 (H) 1.7 - 7.7 K/uL   Lymphocytes Relative 10 %    Lymphs Abs 0.9 0.7 - 4.0 K/uL   Monocytes Relative 5 %   Monocytes Absolute 0.5 0.1 - 1.0 K/uL   Eosinophils Relative 1 %   Eosinophils Absolute 0.1 0.0 - 0.5 K/uL   Basophils Relative 1 %   Basophils Absolute 0.1 0.0 - 0.1 K/uL   Immature Granulocytes 0 %   Abs Immature Granulocytes 0.02 0.00 - 0.07 K/uL    Comment: Performed at [facility], [place] [address] [place], [place] [address]  Lipase, blood     Status: None   Collection Time: 03/08/20  1:11 PM  Result Value Ref Range   Lipase 30 11 - 51 U/L    Comment: Performed at [facility], [place] [address], [place], [place] [address]  Comprehensive metabolic panel     Status: Abnormal   Collection Time: 03/08/20  1:11 PM  Result Value Ref Range   Sodium 138 135 -  145 mmol/L   Potassium 3.9 3.5 - 5.1 mmol/L   Chloride 103 98 - 111 mmol/L   CO2 23 22 - 32 mmol/L   Glucose, Bld 107 (H) 70 - 99 mg/dL    Comment: Glucose reference range applies only to samples taken after fasting for at least 8 hours.   BUN 7 6 - 20 mg/dL   Creatinine, Ser 0.85 0.44 - 1.00 mg/dL   Calcium 9.3 8.9 - 10.3 mg/dL   Total Protein 8.5 (H) 6.5 - 8.1 g/dL   Albumin 4.2 3.5 - 5.0 g/dL   AST 127 (H) 15 - 41 U/L   ALT 147 (H) 0 - 44 U/L   Alkaline Phosphatase 141 (H) 38 - 126 U/L   Total Bilirubin 0.6 0.3 - 1.2 mg/dL   GFR, Estimated >60 >60 mL/min    Comment: (NOTE) Calculated using the CKD-EPI Creatinine Equation (2021)    Anion gap 12 5 - 15    Comment: Performed at [facility], [place] [address] [place], [place] [address]  Lactic acid, plasma     Status: None   Collection Time: 03/08/20  1:11 PM  Result Value Ref Range   Lactic Acid, Venous 0.9 0.5 - 1.9 mmol/L    Comment: Performed at [facility], [place] [address] [place], [place] [address]  Blood culture (routine x 2)     Status: None (Preliminary result)   Collection Time: 03/08/20  1:11 PM   Specimen: BLOOD  Result Value  Ref Range   Specimen Description      BLOOD RIGHT ANTECUBITAL Performed at [place] [address], [place], [place] [address]    Special Requests      BOTTLES DRAWN AEROBIC AND ANAEROBIC Blood Culture adequate volume Performed at [place] [address] [place], [place] [address]    Culture      NO GROWTH 2 DAYS Performed at [facility], [place] [address] [place], [place] [address]    Report Status PENDING   TSH     Status: None   Collection Time: 03/08/20  1:11 PM  Result Value Ref Range   TSH 1.858 0.350 - 4.500 uIU/mL    Comment: Performed by a 3rd Generation assay with a functional sensitivity of <=0.01 uIU/mL. Performed at [facility], [place] [address], [place], [place] [address]   Blood culture (routine x 2)     Status: None (Preliminary result)   Collection Time: 03/08/20  1:13 PM   Specimen: BLOOD RIGHT HAND  Result Value Ref Range   Specimen Description      BLOOD RIGHT HAND Performed at [facility], [place] [address], [place], [place] [address]    Special Requests      BOTTLES DRAWN AEROBIC AND ANAEROBIC Blood Culture adequate volume Performed at [place] [address], [place], [place] [address]    Culture      NO GROWTH 2 DAYS Performed at [facility], [place] [address], [place], [place] [address]    Report Status PENDING   Lactic acid, plasma     Status: Abnormal   Collection Time: 03/08/20  3:40 PM  Result Value Ref Range   Lactic Acid, Venous 10.2 (HH) 0.5 - 1.9 mmol/L    Comment: REPEATED TO VERIFY CRITICAL RESULT CALLED TO, READ BACK BY AND VERIFIED WITH: WOODY,A AT 1656 ON 03/08/2020 BY JPM  Performed at [facility],  [place] [address] [place], [place] [address]   Urinalysis, Routine w reflex microscopic     Status: Abnormal   Collection Time: 03/08/20  3:40 PM  Result Value Ref Range   Color, Urine YELLOW YELLOW   APPearance  CLEAR CLEAR   Specific Gravity, Urine 1.014 1.005 - 1.030   pH 5.0 5.0 - 8.0   Glucose, UA NEGATIVE NEGATIVE mg/dL   Hgb urine dipstick NEGATIVE NEGATIVE   Bilirubin Urine NEGATIVE NEGATIVE   Ketones, ur 5 (A) NEGATIVE mg/dL   Protein, ur NEGATIVE NEGATIVE mg/dL   Nitrite NEGATIVE NEGATIVE   Leukocytes,Ua NEGATIVE NEGATIVE    Comment: Performed at [place] [address] [place], [place] [address]  Ethanol     Status: None   Collection Time: 03/08/20  3:40 PM  Result Value Ref Range   Alcohol, Ethyl (B) <10 <10 mg/dL    Comment: (NOTE) Lowest detectable limit for serum alcohol is 10 mg/dL.  For medical purposes only. Performed at [facility], [place] [address] [place], [place] [address]   Salicylate level     Status: Abnormal   Collection Time: 03/08/20  3:40 PM  Result Value Ref Range   Salicylate Lvl <3.7 (L) 7.0 - 30.0 mg/dL    Comment: Performed at [facility], [place] [address], [place], [place] [address]  Resp Panel by RT-PCR (Flu A&B, Covid) Nasopharyngeal Swab     Status: None   Collection Time: 03/08/20  3:40 PM   Specimen: Nasopharyngeal Swab; Nasopharyngeal(NP) swabs in vial transport medium  Result Value Ref Range   SARS Coronavirus 2 by RT PCR NEGATIVE NEGATIVE    Comment: (NOTE) SARS-CoV-2 target nucleic acids are NOT DETECTED.  The SARS-CoV-2 RNA is generally detectable in upper respiratory specimens during the acute phase of infection. The lowest concentration of SARS-CoV-2 viral copies this assay can detect is 138 copies/mL. A negative result does not preclude SARS-Cov-2 infection and should not be used as the sole basis for treatment or other patient management decisions. A negative result may occur with  improper specimen collection/handling, submission of specimen other than nasopharyngeal swab, presence of viral mutation(s) within the areas targeted by this assay, and inadequate number of  viral copies(<138 copies/mL). A negative result must be combined with clinical observations, patient history, and epidemiological information. The expected result is Negative.  Fact Sheet for Patients:  EntrepreneurPulse.com.au  Fact Sheet for Healthcare Providers:  IncredibleEmployment.be  This test is no t yet approved or cleared by the [place] FDA and  has been authorized for detection and/or diagnosis of SARS-CoV-2 by FDA under an Emergency Use Authorization (EUA). This EUA will remain  in effect (meaning this test can be used) for the duration of the COVID-19 declaration under Section 564(b)(1) of the Act, 21 U.S.C.section 360bbb-3(b)(1), unless the authorization is terminated  or revoked sooner.       Influenza A by PCR NEGATIVE NEGATIVE   Influenza B by PCR NEGATIVE NEGATIVE    Comment: (NOTE) The Xpert Xpress SARS-CoV-2/FLU/RSV plus assay is intended as an aid in the diagnosis of influenza from Nasopharyngeal swab specimens and should not be used as a sole basis for treatment. Nasal washings and aspirates are unacceptable for Xpert Xpress SARS-CoV-2/FLU/RSV testing.  Fact Sheet for Patients: EntrepreneurPulse.com.au  Fact Sheet for Healthcare Providers: IncredibleEmployment.be  This test is not yet approved or cleared by the [place] FDA and has been authorized for detection and/or diagnosis of SARS-CoV-2 by FDA under  an Emergency Use Authorization (EUA). This EUA will remain in effect (meaning this test can be used) for the duration of the COVID-19 declaration under Section 564(b)(1) of the Act, 21 U.S.C. section 360bbb-3(b)(1), unless the authorization is terminated or revoked.  Performed at [facility], [place] [address], [place], [place] [address]   Acetaminophen level     Status: Abnormal   Collection Time: 03/08/20  3:40 PM  Result Value Ref Range    Acetaminophen (Tylenol), Serum <10 (L) 10 - 30 ug/mL    Comment: (NOTE) Therapeutic concentrations vary significantly. A range of 10-30 ug/mL  may be an effective concentration for many patients. However, some  are best treated at concentrations outside of this range. Acetaminophen concentrations >150 ug/mL at 4 hours after ingestion  and >50 ug/mL at 12 hours after ingestion are often associated with  toxic reactions.  Performed at [facility], [place] [address] [place], [place] [address]   Urine rapid drug screen (hosp performed)     Status: None   Collection Time: 03/08/20  3:40 PM  Result Value Ref Range   Opiates NONE DETECTED NONE DETECTED   Cocaine NONE DETECTED NONE DETECTED   Benzodiazepines NONE DETECTED NONE DETECTED   Amphetamines NONE DETECTED NONE DETECTED   Tetrahydrocannabinol NONE DETECTED NONE DETECTED   Barbiturates NONE DETECTED NONE DETECTED    Comment: (NOTE) DRUG SCREEN FOR MEDICAL PURPOSES ONLY.  IF CONFIRMATION IS NEEDED FOR ANY PURPOSE, NOTIFY LAB WITHIN 5 DAYS.  LOWEST DETECTABLE LIMITS FOR URINE DRUG SCREEN Drug Class                     Cutoff (ng/mL) Amphetamine and metabolites    1000 Barbiturate and metabolites    200 Benzodiazepine                 711 Tricyclics and metabolites     300 Opiates and metabolites        300 Cocaine and metabolites        300 THC                            50 Performed at [facility], [place] [address], [place], [place] [address]   Urine culture     Status: Abnormal   Collection Time: 03/08/20  4:00 PM   Specimen: Urine, Random  Result Value Ref Range   Specimen Description      URINE, RANDOM Performed at [place] [address] [place], [place] [address]    Special Requests      NONE Performed at [facility], [place] [address] [place], [place] [address]    Culture MULTIPLE SPECIES PRESENT, SUGGEST RECOLLECTION (A)    Report  Status 03/10/2020 FINAL   HIV Antibody (routine testing w rflx)     Status: None   Collection Time: 03/08/20  5:04 PM  Result Value Ref Range   HIV Screen 4th Generation wRfx Non Reactive Non Reactive    Comment: Performed at [facility], [place] [address] [place], [place] [address]  CBC     Status: Abnormal   Collection Time: 03/08/20  5:04 PM  Result Value Ref Range   WBC 8.1 4.0 - 10.5 K/uL   RBC 4.06 3.87 - 5.11 MIL/uL   Hemoglobin 11.5 (L) 12.0 - 15.0 g/dL   HCT 35.1 (L) 36.0 - 46.0 %   MCV  86.5 80.0 - 100.0 fL   MCH 28.3 26.0 - 34.0 pg   MCHC 32.8 30.0 - 36.0 g/dL   RDW 14.4 11.5 - 15.5 %   Platelets 362 150 - 400 K/uL   nRBC 0.0 0.0 - 0.2 %    Comment: Performed at [facility], [place] [address] [place], [place] [address]  Creatinine, serum     Status: None   Collection Time: 03/08/20  5:04 PM  Result Value Ref Range   Creatinine, Ser 0.81 0.44 - 1.00 mg/dL   GFR, Estimated >60 >60 mL/min    Comment: (NOTE) Calculated using the CKD-EPI Creatinine Equation (2021) Performed at [facility], [place] [address] [place], [place] [address]   Magnesium     Status: Abnormal   Collection Time: 03/08/20  5:04 PM  Result Value Ref Range   Magnesium 1.5 (L) 1.7 - 2.4 mg/dL    Comment: Performed at [facility], [place] [address], [place], [place] [address]  C Difficile Quick Screen w PCR reflex     Status: None   Collection Time: 03/08/20  5:30 PM   Specimen: STOOL  Result Value Ref Range   C Diff antigen NEGATIVE NEGATIVE   C Diff toxin NEGATIVE NEGATIVE   C Diff interpretation No C. difficile detected.     Comment: Performed at [facility], [place] [address], [place], [place] [address]  Lactic acid, plasma     Status: None   Collection Time: 03/08/20  5:34 PM  Result Value Ref Range   Lactic Acid, Venous 1.0 0.5 - 1.9 mmol/L    Comment: Performed at [facility], [place] [address] [place] [address], [place], [place] [address]  Hepatitis panel, acute     Status: None   Collection Time: 03/08/20  5:40 PM  Result Value Ref Range   Hepatitis B Surface Ag NON REACTIVE NON REACTIVE   HCV Ab NON REACTIVE NON REACTIVE    Comment: (NOTE) Nonreactive HCV antibody screen is consistent with no HCV infections,  unless recent infection is suspected or other evidence exists to indicate HCV infection.     Hep A IgM NON REACTIVE NON REACTIVE   Hep B C IgM NON REACTIVE NON REACTIVE    Comment: Performed at [facility], [place] [address], [place], [place] [address]  Lactic acid, plasma     Status: None   Collection Time: 03/08/20  7:00 PM  Result Value Ref Range   Lactic Acid, Venous 0.8 0.5 - 1.9 mmol/L    Comment: Performed at [facility], [place] [address], [place], [place] [address]  Protime-INR     Status: None   Collection Time: 03/08/20 10:00 PM  Result Value Ref Range   Prothrombin Time 15.2 11.4 - 15.2 seconds   INR 1.2 0.8 - 1.2    Comment: (NOTE) INR goal varies based on device and disease states. Performed at [facility], [place] [address], [place], [place] [address]   APTT     Status: Abnormal   Collection Time: 03/08/20 10:00 PM  Result Value Ref Range   aPTT 37 (H) 24 - 36 seconds    Comment:        IF BASELINE aPTT IS ELEVATED, SUGGEST PATIENT RISK ASSESSMENT BE USED TO DETERMINE APPROPRIATE ANTICOAGULANT THERAPY. Performed at [facility], [place] [address] [place], [place] [address]   Lactic acid, plasma     Status: None  Collection Time: 03/08/20 10:02 PM  Result Value Ref Range   Lactic Acid, Venous 0.7 0.5 - 1.9 mmol/L    Comment: Performed at [facility], [place] [address], [place], [place] [address]  Protime-INR     Status: Abnormal   Collection Time: 03/09/20  3:01 AM  Result Value Ref Range   Prothrombin Time 16.0 (H) 11.4 - 15.2 seconds   INR 1.3 (H) 0.8 - 1.2    Comment:  (NOTE) INR goal varies based on device and disease states. Performed at [facility], [place] [address], [place], [place] [address]   Cortisol-am, blood     Status: Abnormal   Collection Time: 03/09/20  3:01 AM  Result Value Ref Range   Cortisol - AM 6.6 (L) 6.7 - 22.6 ug/dL    Comment: Performed at [place] [address], [place], [place] [address]  Procalcitonin     Status: None   Collection Time: 03/09/20  3:01 AM  Result Value Ref Range   Procalcitonin 0.12 ng/mL    Comment:        Interpretation: PCT (Procalcitonin) <= 0.5 ng/mL: Systemic infection (sepsis) is not likely. Local bacterial infection is possible. (NOTE)       Sepsis PCT Algorithm           Lower Respiratory Tract                                      Infection PCT Algorithm    ----------------------------     ----------------------------         PCT < 0.25 ng/mL                PCT < 0.10 ng/mL          Strongly encourage             Strongly discourage   discontinuation of antibiotics    initiation of antibiotics    ----------------------------     -----------------------------       PCT 0.25 - 0.50 ng/mL            PCT 0.10 - 0.25 ng/mL               OR       >80% decrease in PCT            Discourage initiation of                                            antibiotics      Encourage discontinuation           of antibiotics    ----------------------------     -----------------------------         PCT >= 0.50 ng/mL              PCT 0.26 - 0.50 ng/mL               AND        <80% decrease in PCT             Encourage initiation of  antibiotics       Encourage continuation           of antibiotics    ----------------------------     -----------------------------        PCT >= 0.50 ng/mL                  PCT > 0.50 ng/mL               AND         increase in PCT                  Strongly encourage                                       initiation of antibiotics    Strongly encourage escalation           of antibiotics                                     -----------------------------                                           PCT <= 0.25 ng/mL                                                 OR                                        > 80% decrease in PCT                                      Discontinue / Do not initiate                                             antibiotics  Performed at [place] [address], [place], [place] [address]   Comprehensive metabolic panel     Status: Abnormal   Collection Time: 03/09/20  3:01 AM  Result Value Ref Range   Sodium 137 135 - 145 mmol/L   Potassium 4.6 3.5 - 5.1 mmol/L   Chloride 105 98 - 111 mmol/L   CO2 25 22 - 32 mmol/L   Glucose, Bld 156 (H) 70 - 99 mg/dL    Comment: Glucose reference range applies only to samples taken after fasting for at least 8 hours.   BUN 6 6 - 20 mg/dL   Creatinine, Ser 0.78 0.44 - 1.00 mg/dL   Calcium 8.5 (L) 8.9 - 10.3 mg/dL   Total Protein 6.8 6.5 - 8.1 g/dL   Albumin 3.2 (L) 3.5 - 5.0 g/dL   AST 81 (H) 15 - 41 U/L   ALT 124 (H) 0 - 44 U/L   Alkaline Phosphatase 111 38 - 126 U/L  Total Bilirubin 0.4 0.3 - 1.2 mg/dL   GFR, Estimated >60 >60 mL/min    Comment: (NOTE) Calculated using the CKD-EPI Creatinine Equation (2021)    Anion gap 7 5 - 15    Comment: Performed at [facility], [place] [address], [place], [place] [address]  CBC with Differential/Platelet     Status: Abnormal   Collection Time: 03/09/20  8:10 AM  Result Value Ref Range   WBC 9.7 4.0 - 10.5 K/uL   RBC 4.01 3.87 - 5.11 MIL/uL   Hemoglobin 11.4 (L) 12.0 - 15.0 g/dL   HCT 34.8 (L) 36.0 - 46.0 %   MCV 86.8 80.0 - 100.0 fL   MCH 28.4 26.0 - 34.0 pg   MCHC 32.8 30.0 - 36.0 g/dL   RDW 14.6 11.5 - 15.5 %   Platelets 393 150 - 400 K/uL   nRBC 0.0 0.0 - 0.2 %   Neutrophils Relative % 90 %   Neutro Abs 8.7 (H) 1.7 - 7.7 K/uL    Lymphocytes Relative 7 %   Lymphs Abs 0.7 0.7 - 4.0 K/uL   Monocytes Relative 3 %   Monocytes Absolute 0.2 0.1 - 1.0 K/uL   Eosinophils Relative 0 %   Eosinophils Absolute 0.0 0.0 - 0.5 K/uL   Basophils Relative 0 %   Basophils Absolute 0.0 0.0 - 0.1 K/uL   Immature Granulocytes 0 %   Abs Immature Granulocytes 0.01 0.00 - 0.07 K/uL    Comment: Performed at [facility], [place] [address] [place], [place] [address]  Sedimentation rate     Status: None   Collection Time: 03/09/20 11:07 AM  Result Value Ref Range   Sed Rate 22 0 - 22 mm/hr    Comment: Performed at [facility], [place] [address] [place], [place] [address]  High sensitivity CRP     Status: Abnormal   Collection Time: 03/09/20 11:07 AM  Result Value Ref Range   CRP, High Sensitivity 179.75 (H) 0.00 - 3.00 mg/L    Comment: (NOTE) Results confirmed on dilution.         Relative Risk for Future Cardiovascular Event                             Low                 <1.00                             Average       1.00 - 3.00                             High                >3.00 Performed At: [facility] [place], [place] [id number] Rush Farmer MD [id number]   Comprehensive metabolic panel     Status: Abnormal   Collection Time: 03/10/20  3:42 AM  Result Value Ref Range   Sodium 137 135 - 145 mmol/L   Potassium 4.3 3.5 - 5.1 mmol/L   Chloride 106 98 - 111 mmol/L   CO2 23 22 - 32 mmol/L   Glucose, Bld 140 (H) 70 - 99 mg/dL    Comment: Glucose reference range applies only to samples taken after fasting for at least 8 hours.  BUN 9 6 - 20 mg/dL   Creatinine, Ser 0.72 0.44 - 1.00 mg/dL   Calcium 8.6 (L) 8.9 - 10.3 mg/dL   Total Protein 6.9 6.5 - 8.1 g/dL   Albumin 3.3 (L) 3.5 - 5.0 g/dL   AST 46 (H) 15 - 41 U/L   ALT 98 (H) 0 - 44 U/L   Alkaline Phosphatase 105 38 - 126 U/L   Total Bilirubin 0.5 0.3 - 1.2 mg/dL   GFR, Estimated >60 >60 mL/min    Comment:  (NOTE) Calculated using the CKD-EPI Creatinine Equation (2021)    Anion gap 8 5 - 15    Comment: Performed at [facility], [place] [address] [place], [place] [address]    Studies/Results: DG Chest 1 View  Result Date: 03/08/2020 CLINICAL DATA:  Fever, body aches and tachycardia for 3 days. EXAM: CHEST  1 VIEW COMPARISON:  03/05/2020 chest radiograph FINDINGS: The cardiomediastinal silhouette is unremarkable. There is no evidence of focal airspace disease, pulmonary edema, suspicious pulmonary nodule/mass, pleural effusion, or pneumothorax. No acute bony abnormalities are identified. A moderate apex RIGHT LOWER thoracic scoliosis is again noted. IMPRESSION: No active disease. Electronically Signed   By: Margarette Canada M.D.   On: 03/08/2020 14:12   CT ABDOMEN PELVIS W CONTRAST  Result Date: 03/08/2020 CLINICAL DATA:  Sec sepsis. Colostomy reversal and mid abdomen pain. Fever. History of ulcerative colitis. EXAM: CT ABDOMEN AND PELVIS WITH CONTRAST TECHNIQUE: Multidetector CT imaging of the abdomen and pelvis was performed using the standard protocol following bolus administration of intravenous contrast. CONTRAST:  125m OMNIPAQUE IOHEXOL 300 MG/ML  SOLN COMPARISON:  03/05/2020 FINDINGS: Lower chest: The lung bases are clear. The heart size is normal. Hepatobiliary: The liver is normal. Normal gallbladder.There is suggestion of mild intrahepatic biliary ductal dilatation. Pancreas: Normal contours without ductal dilatation. No peripancreatic fluid collection. Spleen: Unremarkable. Adrenals/Urinary Tract: --Adrenal glands: Unremarkable. --Right kidney/ureter: No hydronephrosis or radiopaque kidney stones. --Left kidney/ureter: No hydronephrosis or radiopaque kidney stones. --Urinary bladder: Unremarkable. Stomach/Bowel: --Stomach/Duodenum: No hiatal hernia or other gastric abnormality. Normal duodenal course and caliber. --Small bowel: Unremarkable. --Colon: Again noted are findings  consistent with pancolitis. The circumferential wall thickening of the colon has slightly improved from prior study. Liquid stool remains throughout the colon. --Appendix: Normal. Vascular/Lymphatic: Normal course and caliber of the major abdominal vessels. --No retroperitoneal lymphadenopathy. --mildly enlarged mesenteric lymph nodes are noted, especially in the right lower quadrant. --No pelvic or inguinal lymphadenopathy. Reproductive: Unremarkable Other: No ascites or free air. The abdominal wall is normal. Musculoskeletal. 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 IMPRESSION: 1. Again noted are findings of pancolitis consistent with the patient's history of ulcerative colitis. This appears to have improved slightly from recent prior study. 2. Reactive mesenteric lymph nodes are again noted. 3. Possible mild intrahepatic biliary ductal dilatation. 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 This finding would be better evaluated by MRI. Electronically Signed   By: CConstance HolsterM.D.   On: 03/08/2020 20:02   MR 3D Recon At Scanner  Result Date: 03/09/2020 CLINICAL DATA:  Abdominal pain, biliary obstruction suspected, intrahepatic biliary ductal dilatation on prior CT, history of ulcerative colitis, evaluate for primary sclerosing cholangitis EXAM: MRI ABDOMEN WITHOUT AND WITH CONTRAST (INCLUDING MRCP) TECHNIQUE: Multiplanar multisequence MR imaging of the abdomen was performed both before and after the administration of intravenous contrast. Heavily T2-weighted images of the biliary and pancreatic ducts were  obtained, and three-dimensional MRCP images were rendered by post processing. CONTRAST:  1m GADAVIST GADOBUTROL 1 MMOL/ML IV SOLN COMPARISON:  CT abdomen pelvis, 03/08/2020 FINDINGS: Lower chest: No acute findings. Hepatobiliary: No mass or other parenchymal abnormality identified. No biliary ductal  dilatation. The common bile duct measures no greater than 4 mm in caliber. Evaluation is somewhat limited by breath motion artifact. Contracted gallbladder. Pancreas: No mass, inflammatory changes, or other parenchymal abnormality identified. Spleen:  Within normal limits in size and appearance. Adrenals/Urinary Tract: No masses identified. No evidence of hydronephrosis. Stomach/Bowel: Visualized portions within the abdomen are unremarkable. Vascular/Lymphatic: No pathologically enlarged lymph nodes identified. No abdominal aortic aneurysm demonstrated. Other:  None. Musculoskeletal: No suspicious bone lesions identified. Pectus deformity of the partially imaged lower chest. IMPRESSION: Evaluation of the biliary system is somewhat limited by breath motion artifact. Within this limitation, no biliary ductal dilatation or other obvious abnormality. The common bile duct measures no greater than 4 mm in caliber. Electronically Signed   By: AEddie CandleM.D.   On: 03/09/2020 16:13   MR ABDOMEN MRCP W WO CONTAST  Result Date: 03/09/2020 CLINICAL DATA:  Abdominal pain, biliary obstruction suspected, intrahepatic biliary ductal dilatation on prior CT, history of ulcerative colitis, evaluate for primary sclerosing cholangitis EXAM: MRI ABDOMEN WITHOUT AND WITH CONTRAST (INCLUDING MRCP) TECHNIQUE: Multiplanar multisequence MR imaging of the abdomen was performed both before and after the administration of intravenous contrast. Heavily T2-weighted images of the biliary and pancreatic ducts were obtained, and three-dimensional MRCP images were rendered by post processing. CONTRAST:  73mGADAVIST GADOBUTROL 1 MMOL/ML IV SOLN COMPARISON:  CT abdomen pelvis, 03/08/2020 FINDINGS: Lower chest: No acute findings. Hepatobiliary: No mass or other parenchymal abnormality identified. No biliary ductal dilatation. The common bile duct measures no greater than 4 mm in caliber. Evaluation is somewhat limited by breath motion artifact.  Contracted gallbladder. Pancreas: No mass, inflammatory changes, or other parenchymal abnormality identified. Spleen:  Within normal limits in size and appearance. Adrenals/Urinary Tract: No masses identified. No evidence of hydronephrosis. Stomach/Bowel: Visualized portions within the abdomen are unremarkable. Vascular/Lymphatic: No pathologically enlarged lymph nodes identified. No abdominal aortic aneurysm demonstrated. Other:  None. Musculoskeletal: No suspicious bone lesions identified. Pectus deformity of the partially imaged lower chest. IMPRESSION: Evaluation of the biliary system is somewhat limited by breath motion artifact. Within this limitation, no biliary ductal dilatation or other obvious abnormality. The common bile duct measures no greater than 4 mm in caliber. Electronically Signed   By: AlEddie Candle.D.   On: 03/09/2020 16:13   USKoreabdomen Limited RUQ (LIVER/GB)  Result Date: 03/08/2020 CLINICAL DATA:  Elevated LFTs EXAM: ULTRASOUND ABDOMEN LIMITED RIGHT UPPER QUADRANT COMPARISON:  03/05/2020 FINDINGS: Gallbladder: No gallstones or wall thickening visualized. No sonographic Murphy sign noted by sonographer. Common bile duct: Diameter: 2 mm Liver: No focal lesion identified. Within normal limits in parenchymal echogenicity. Portal vein is patent on color Doppler imaging with normal direction of blood flow towards the liver. Other: None. IMPRESSION: Unremarkable exam Electronically Signed   By: ChConstance Holster.D.   On: 03/08/2020 15:27    Medications: I have reviewed the patient's current medications.  Assessment: History of ulcerative colitis-pancolitis noted on CAT scan from 03/08/2020 with slight improvement from recent study  Profoundly increased inflammatory markers, CRP 179.75(normal up to 3) with a normal ESR of 22 C. difficile negative GI pathogen panel sent, results pending  Abnormal LFTs, trending down, T bili 0.5/AST 46/ALT 98/ALP 105, no leukocytosis Initial concerns  for primary sclerosing cholangitis not evident on MRCP, CBD 4 mm   Plan: Advance to regular diet Fever likely related to ulcerative colitis( acute inflammation, elevated CRP) Plans for discharge today, recommend tapering dose of prednisone(40 mg p.o. daily for a week, then 30 mg p.o. daily for a week, then 20 mg p.o. daily for a week, then 10 mg p.o. daily for a week, then stop). Patient will need to be on medication for maintenance, likely biologic therapy. Patient wants to follow-up with Dr. Renee Harder at [facility] on discharge.  Ronnette Juniper, MD 03/10/2020, 9:02 AM

## 2020-03-10 NOTE — Discharge Summary (Addendum)
Physician Discharge Summary  Laura Vaughn UUV:[id number] DOB: 09/08/1995 DOA: 03/08/2020  PCP: Patient, No Pcp Per  Admit date: 03/08/2020 Discharge date: 03/10/2020 30 Day Unplanned Readmission Risk Score   Flowsheet Row ED to Hosp-Admission (Current) from 03/08/2020 in [facility]  30 Day Unplanned Readmission Risk Score (%) 16.42 Filed at 03/10/2020 0801     This score is the patient's risk of an unplanned readmission within 30 days of being discharged (0 -100%). The score is based on dignosis, age, lab data, medications, orders, and past utilization.   Low:  0-14.9   Medium: 15-21.9   High: 22-29.9   Extreme: 30 and above         Admitted From: Home Disposition: Home  Recommendations for Outpatient Follow-up:  1. Follow up with PCP in 1-2 weeks 2. Follow-up with your primary GI within 2 to 4 weeks 3. Please obtain BMP/CBC in one week 4. Please follow up with your PCP on the following pending results: Unresulted Labs (From admission, onward)          Start     Ordered   03/15/20 0500  Creatinine, serum  (enoxaparin (LOVENOX)    CrCl >/= 30 ml/min)  Weekly,   R     Comments: while on enoxaparin therapy    03/08/20 1707   03/10/20 0500  QuantiFERON-TB Gold Plus  Tomorrow morning,   R       Question:  Specimen collection method  Answer:  Lab=Lab collect   03/09/20 1026   03/10/20 0500  Comprehensive metabolic panel  Daily,   R     Question:  Specimen collection method  Answer:  Lab=Lab collect   03/09/20 1028   03/09/20 1029  ANCA Titers  (Anti-Neutrophilic Cystoplasmic Antibody Panel (PNL))  Once,   R       Question:  Specimen collection method  Answer:  Lab=Lab collect   03/09/20 1028   03/09/20 1027  Mitochondrial antibodies  Once,   R       Question:  Specimen collection method  Answer:  Lab=Lab collect   03/09/20 1026   03/09/20 1027  Anti-smooth muscle antibody, IgG  Once,   R       Question:  Specimen collection method   Answer:  Lab=Lab collect   03/09/20 1026   03/09/20 1013  Calprotectin, Fecal  Once,   R        03/09/20 1012   03/09/20 0846  Gastrointestinal Panel by PCR , Stool  (Gastrointestinal Panel by PCR, Stool                                                                                                                                                     *Does Not include CLOSTRIDIUM DIFFICILE testing.**If CDIFF testing is needed, select the C Difficile Quick  Screen (NO PCR Reflex) order below)  Once,   R        03/09/20 0846            Home Health: None Equipment/Devices: None  Discharge Condition: Stable CODE STATUS: Full code Diet recommendation: Regular  Subjective: Seen and examined.  She has no complaint.  No pain or nausea.  Tolerating diet.  Ready to go home.  ERX:VQMGQ Laura Vaughn a 25 y.o.femalewith medical history significant ofADHD and ulcerative colitis presented to ED with multiple complaints including abdominal pain, nausea, vomiting and fever. According to patient, she initially went to [facility] 03/05/2020 with major complaint of generalized weakness and some nausea. Upon arrival to ED, she was febrile as well. Abdominal CT was done which showed pancolitis consistent with her history of ulcerative colitis. She felt better and there was no acute finding found so she was discharged home. According to patient, she was not feeling that well even at the time of discharge however a day after discharge, she started having abdominal pain located at the periumbilical as well as epigastric region and then also started having fever with T-max of 101.3 at home. The pain has been intermittent, aggravated with movement and eating and relieved with rest. Ranges anywhere from 3-6 out of 10. She has intermittent nausea and vomiting along with that and feels generalized weakness. She claims that she has been losing weight gradually in last few months. She used to be on some medications for  ulcerative colitis but she stopped taking them about 6 months ago and her last visit to her GI at [facility] was about a year ago. She has been trying to manage her ulcerative colitis by herself by ingesting/modifying her diet. 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 Patient also has noticed tachycardia at home with rates around 170 through her Fitbit device. Patient denies eating or drinking anything outside other than what ever she cooks at home. There is no other sick contact, all the family members including 2 kids and husband are doing fine without any symptoms. No recent travel. She also tells me that on a regular day, she has approximately 10 watery bowel movements but last since 2 to 3 days, she has been having about 15 to 20 BMs /day.  ED Course:Upon arrival to ED, patient was once again febrile with 101, tachycardic at 151 and tachypneic. CBC with normal white cells, CMP showed normal renal function but elevated LFTs and alkaline phosphatase. Due to this abdominal ultrasound was done which is unremarkable. Chest x-ray unremarkable as well. Multiple labs are still pending including COVID-19, ethanol, UA, acute viral hepatitis panel. Initial lactic acid was normal. Repeat lactic acid is 10. C. difficile is pending.   Brief/Interim Summary: Patient was admitted under hospital service due to sepsis which was thought to be secondary to either hepatitis or acute exacerbation of ulcerative colitis.  She was ruled out of hepatitis.  CT abdomen was done which showed intrahepatic biliary dilatation suspecting possible sclerosing cholangitis.  Patient was already started on antibiotics for sepsis.  MRCP was done which was negative and her biliary tree was within normal range.  GI was consulted.  Patient was also started on Solu-Medrol for acute exacerbation of ulcerative colitis.  Patient  remained fever free, her last temperature was when she was in the ED.  Her sepsis physiology resolved as well.  Per GI, patient does not  have acute infectious cholangitis but possibly may have sclerosing cholangitis due to ulcerative colitis.  They have cleared the patient for discharge.  She was ruled out of C. difficile.  Stool sample has been sent for GI pathogen panel but suspicion for any of those is very low.  GI has cleared the patient for discharge and recommended tapering dose of prednisone over 4 weeks.  They recommended no antibiotics at discharge.  Patient is being discharged in stable condition.  She understands all the instructions.  Discharge Diagnoses:  Principal Problem:   sepsis [facility]) Active Problems:   ADD (attention deficit disorder) without hyperactivity   Ulcerative colitis, acute ([facility])   Elevated LFTs    Discharge Instructions   Allergies as of 03/10/2020   No Known Allergies     Medication List    TAKE these medications   hydrOXYzine 10 MG tablet Commonly known as: ATARAX/VISTARIL Take 10 mg by mouth 3 (three) times daily as needed for anxiety.   predniSONE 10 MG tablet Commonly known as: DELTASONE Take 40 mg (4 tabs) for 7 days, followed by 30 mg (3 tabs) for 7 days starting 3/1//2022, followed by 20 mg (2 tabs) for 7 days starting 03/24/2020, followed by 10 mg p.o. daily for 7 days   traZODone 100 MG tablet Commonly known as: DESYREL Take 100 mg by mouth at bedtime.   venlafaxine XR 75 MG 24 hr capsule Commonly known as: EFFEXOR-XR Take 75 mg by mouth daily.       No Known Allergies  Consultations: GI   Procedures/Studies: DG Chest 1 View  Result Date: 03/08/2020 CLINICAL DATA:  Fever, body aches and tachycardia for 3 days. EXAM: CHEST  1 VIEW COMPARISON:  03/05/2020 chest radiograph FINDINGS: The cardiomediastinal silhouette is unremarkable. There is no evidence of focal airspace disease, pulmonary edema, suspicious pulmonary nodule/mass,  pleural effusion, or pneumothorax. No acute bony abnormalities are identified. A moderate apex RIGHT LOWER thoracic scoliosis is again noted. IMPRESSION: No active disease. Electronically Signed   By: Margarette Canada M.D.   On: 03/08/2020 14:12   CT ABDOMEN PELVIS W CONTRAST  Result Date: 03/08/2020 CLINICAL DATA:  Sec sepsis. Colostomy reversal and mid abdomen pain. Fever. History of ulcerative colitis. EXAM: CT ABDOMEN AND PELVIS WITH CONTRAST TECHNIQUE: Multidetector CT imaging of the abdomen and pelvis was performed using the standard protocol following bolus administration of intravenous contrast. CONTRAST:  163m OMNIPAQUE IOHEXOL 300 MG/ML  SOLN COMPARISON:  03/05/2020 FINDINGS: Lower chest: The lung bases are clear. The heart size is normal. Hepatobiliary: The liver is normal. Normal gallbladder.There is suggestion of mild intrahepatic biliary ductal dilatation. Pancreas: Normal contours without ductal dilatation. No peripancreatic fluid collection. Spleen: Unremarkable. Adrenals/Urinary Tract: --Adrenal glands: Unremarkable. --Right kidney/ureter: No hydronephrosis or radiopaque kidney stones. --Left kidney/ureter: No hydronephrosis or radiopaque kidney stones. --Urinary bladder: Unremarkable. Stomach/Bowel: --Stomach/Duodenum: No hiatal hernia or other gastric abnormality. Normal duodenal course and caliber. --Small bowel: Unremarkable. --Colon: Again noted are findings consistent with pancolitis. The circumferential wall thickening of the colon has slightly improved from prior study. Liquid stool remains throughout the colon. --Appendix: Normal. Vascular/Lymphatic: Normal course and caliber of the major abdominal vessels. --No retroperitoneal lymphadenopathy. --mildly enlarged mesenteric lymph nodes are noted, especially in the right lower quadrant. --No pelvic or inguinal lymphadenopathy. Reproductive: Unremarkable Other: No ascites or free air. The abdominal wall is normal. Musculoskeletal. 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 IMPRESSION: 1. Again noted are findings of pancolitis consistent with  the patient's history of ulcerative colitis. This appears to have improved slightly from recent prior study. 2. Reactive mesenteric lymph nodes are again noted. 3. Possible mild intrahepatic biliary ductal dilatation. 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 This finding would be better evaluated by MRI. Electronically Signed   By: Constance Holster M.D.   On: 03/08/2020 20:02   CT Abdomen Pelvis W Contrast  Result Date: 03/05/2020 CLINICAL DATA:  Abdominal pain, nonlocalized acute. History of ulcerative colitis. Fever. Tachycardic. EXAM: CT ABDOMEN AND PELVIS WITH CONTRAST TECHNIQUE: Multidetector CT imaging of the abdomen and pelvis was performed using the standard protocol following bolus administration of intravenous contrast. CONTRAST:  129m OMNIPAQUE IOHEXOL 300 MG/ML  SOLN COMPARISON:  None. FINDINGS: Lower chest: Left base atelectasis.  No acute abnormality. Hepatobiliary: No focal liver abnormality. The gallbladder is contracted. No gallstones, gallbladder wall thickening, or pericholecystic fluid. No biliary dilatation. Pancreas: No focal lesion. Normal pancreatic contour. No surrounding inflammatory changes. No main pancreatic ductal dilatation. Spleen: Normal in size without focal abnormality. Adrenals/Urinary Tract: No adrenal nodule bilaterally. Bilateral kidneys enhance symmetrically. No hydronephrosis. No hydroureter. The urinary bladder is unremarkable. Stomach/Bowel: Stomach is within normal limits. Circumferential bowel wall thickening and mucosal hyperemia of the entire large bowel with associated fluid-filled lumen. Loss of haustral of the large bowel. Small bowel wall thickening and mucosal hyperemia of a short segment of the terminal ileum. No pneumatosis. The appendix is borderline  enlarged in caliber measuring up to 7-8 mm. Vascular/Lymphatic: Engorgement of the left ovarian veins. No abdominal aorta or iliac aneurysm. No atherosclerotic plaque of the aorta and its branches. Scattered prominent but nonenlarged mesenteric lymph nodes. No abdominal, pelvic, or inguinal lymphadenopathy. Reproductive: Uterus and bilateral adnexa are unremarkable. Other: No intraperitoneal free fluid. No intraperitoneal free gas. No organized fluid collection. Musculoskeletal: Bilateral L5 pars interarticularis defects. No acute or significant osseous findings. IMPRESSION: 1. Pancolitis with associated inflammatory changes of a short segment of terminal ileum and the appendix consistent with given history of ulcerative colitis. Differential diagnosis includes infectious etiology versus less likely ischemic etiology. 2. Scattered but nonenlarged mesenteric lymph nodes that are likely reactive in etiology. 3. 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 Electronically Signed   By: MIven FinnM.D.   On: 03/05/2020 22:55   MR 3D Recon At Scanner  Result Date: 03/09/2020 CLINICAL DATA:  Abdominal pain, biliary obstruction suspected, intrahepatic biliary ductal dilatation on prior CT, history of ulcerative colitis, evaluate for primary sclerosing cholangitis EXAM: MRI ABDOMEN WITHOUT AND WITH CONTRAST (INCLUDING MRCP) TECHNIQUE: Multiplanar multisequence MR imaging of the abdomen was performed both before and after the administration of intravenous contrast. Heavily T2-weighted images of the biliary and pancreatic ducts were obtained, and three-dimensional MRCP images were rendered by post processing. CONTRAST:  765mGADAVIST GADOBUTROL 1 MMOL/ML IV SOLN COMPARISON:  CT abdomen pelvis, 03/08/2020 FINDINGS: Lower chest: No acute findings. Hepatobiliary: No mass or other parenchymal abnormality identified. No biliary ductal dilatation. The common bile duct measures no greater than 4 mm in  caliber. Evaluation is somewhat limited by breath motion artifact. Contracted gallbladder. Pancreas: No mass, inflammatory changes, or other parenchymal abnormality identified. Spleen:  Within normal limits in size and appearance. Adrenals/Urinary Tract: No masses identified. No evidence of hydronephrosis. Stomach/Bowel: Visualized portions within the abdomen are unremarkable. Vascular/Lymphatic: No pathologically enlarged lymph nodes identified. No abdominal aortic aneurysm demonstrated. Other:  None. Musculoskeletal: No suspicious bone lesions identified. Pectus deformity of  the partially imaged lower chest. IMPRESSION: Evaluation of the biliary system is somewhat limited by breath motion artifact. Within this limitation, no biliary ductal dilatation or other obvious abnormality. The common bile duct measures no greater than 4 mm in caliber. Electronically Signed   By: Eddie Candle M.D.   On: 03/09/2020 16:13   DG Chest Port 1 View  Result Date: 03/05/2020 CLINICAL DATA:  Fever EXAM: PORTABLE CHEST 1 VIEW COMPARISON:  None. FINDINGS: The heart size and mediastinal contours are within normal limits. Both lungs are clear. Right convex scoliosis. IMPRESSION: No active disease. Electronically Signed   By: Ulyses Jarred M.D.   On: 03/05/2020 20:34   MR ABDOMEN MRCP W WO CONTAST  Result Date: 03/09/2020 CLINICAL DATA:  Abdominal pain, biliary obstruction suspected, intrahepatic biliary ductal dilatation on prior CT, history of ulcerative colitis, evaluate for primary sclerosing cholangitis EXAM: MRI ABDOMEN WITHOUT AND WITH CONTRAST (INCLUDING MRCP) TECHNIQUE: Multiplanar multisequence MR imaging of the abdomen was performed both before and after the administration of intravenous contrast. Heavily T2-weighted images of the biliary and pancreatic ducts were obtained, and three-dimensional MRCP images were rendered by post processing. CONTRAST:  63m GADAVIST GADOBUTROL 1 MMOL/ML IV SOLN COMPARISON:  CT abdomen  pelvis, 03/08/2020 FINDINGS: Lower chest: No acute findings. Hepatobiliary: No mass or other parenchymal abnormality identified. No biliary ductal dilatation. The common bile duct measures no greater than 4 mm in caliber. Evaluation is somewhat limited by breath motion artifact. Contracted gallbladder. Pancreas: No mass, inflammatory changes, or other parenchymal abnormality identified. Spleen:  Within normal limits in size and appearance. Adrenals/Urinary Tract: No masses identified. No evidence of hydronephrosis. Stomach/Bowel: Visualized portions within the abdomen are unremarkable. Vascular/Lymphatic: No pathologically enlarged lymph nodes identified. No abdominal aortic aneurysm demonstrated. Other:  None. Musculoskeletal: No suspicious bone lesions identified. Pectus deformity of the partially imaged lower chest. IMPRESSION: Evaluation of the biliary system is somewhat limited by breath motion artifact. Within this limitation, no biliary ductal dilatation or other obvious abnormality. The common bile duct measures no greater than 4 mm in caliber. Electronically Signed   By: AEddie CandleM.D.   On: 03/09/2020 16:13   UKoreaAbdomen Limited RUQ (LIVER/GB)  Result Date: 03/08/2020 CLINICAL DATA:  Elevated LFTs EXAM: ULTRASOUND ABDOMEN LIMITED RIGHT UPPER QUADRANT COMPARISON:  03/05/2020 FINDINGS: Gallbladder: No gallstones or wall thickening visualized. No sonographic Murphy sign noted by sonographer. Common bile duct: Diameter: 2 mm Liver: No focal lesion identified. Within normal limits in parenchymal echogenicity. Portal vein is patent on color Doppler imaging with normal direction of blood flow towards the liver. Other: None. IMPRESSION: Unremarkable exam Electronically Signed   By: CConstance HolsterM.D.   On: 03/08/2020 15:27     Discharge Exam: Vitals:   03/09/20 2040 03/10/20 0457  BP: 110/72 99/68  Pulse: (!) 59 (!) 54  Resp: 18 18  Temp: 97.9 F (36.6 C) 97.7 F (36.5 C)  SpO2: 98% 97%    Vitals:   03/09/20 0500 03/09/20 0819 03/09/20 2040 03/10/20 0457  BP:  105/72 110/72 99/68  Pulse:  68 (!) 59 (!) 54  Resp:  15 18 18   Temp:  98.3 F (36.8 C) 97.9 F (36.6 C) 97.7 F (36.5 C)  TempSrc:   Oral Oral  SpO2:  99% 98% 97%  Weight: 74 kg     Height: 5' 8"  (1.727 m)       General: Pt is alert, awake, not in acute distress Cardiovascular: RRR, S1/S2 +, no rubs, no  gallops Respiratory: CTA bilaterally, no wheezing, no rhonchi Abdominal: Soft, NT, ND, bowel sounds + Extremities: no edema, no cyanosis    The results of significant diagnostics from this hospitalization (including imaging, microbiology, ancillary and laboratory) are listed below for reference.     Microbiology: Recent Results (from the past 240 hour(s))  SARS CORONAVIRUS 2 (TAT 6-24 HRS) Nasopharyngeal Nasopharyngeal Swab     Status: None   Collection Time: 03/05/20 10:24 PM   Specimen: Nasopharyngeal Swab  Result Value Ref Range Status   SARS Coronavirus 2 NEGATIVE NEGATIVE Final    Comment: (NOTE) SARS-CoV-2 target nucleic acids are NOT DETECTED.  The SARS-CoV-2 RNA is generally detectable in upper and lower respiratory specimens during the acute phase of infection. Negative results do not preclude SARS-CoV-2 infection, do not rule out co-infections with other pathogens, and should not be used as the sole basis for treatment or other patient management decisions. Negative results must be combined with clinical observations, patient history, and epidemiological information. The expected result is Negative.  Fact Sheet for Patients: SugarRoll.be  Fact Sheet for Healthcare Providers: https://www.woods-mathews.com/  This test is not yet approved or cleared by the [place] FDA and  has been authorized for detection and/or diagnosis of SARS-CoV-2 by FDA under an Emergency Use Authorization (EUA). This EUA will remain  in effect (meaning this test  can be used) for the duration of the COVID-19 declaration under Se ction 564(b)(1) of the Act, 21 U.S.C. section 360bbb-3(b)(1), unless the authorization is terminated or revoked sooner.  Performed at [facility], [place] [address], [place], [place] [address]   Blood culture (routine x 2)     Status: None (Preliminary result)   Collection Time: 03/08/20  1:11 PM   Specimen: BLOOD  Result Value Ref Range Status   Specimen Description   Final    BLOOD RIGHT ANTECUBITAL Performed at [facility] [address], [place], [place] [address]    Special Requests   Final    BOTTLES DRAWN AEROBIC AND ANAEROBIC Blood Culture adequate volume Performed at [facility] [address], [place], [place] [address]    Culture   Final    NO GROWTH 2 DAYS Performed at [facility] [address], [place], [place] [address]    Report Status PENDING  Incomplete  Blood culture (routine x 2)     Status: None (Preliminary result)   Collection Time: 03/08/20  1:13 PM   Specimen: BLOOD RIGHT HAND  Result Value Ref Range Status   Specimen Description   Final    BLOOD RIGHT HAND Performed at [facility], [place] [address] [place], [place] [address]    Special Requests   Final    BOTTLES DRAWN AEROBIC AND ANAEROBIC Blood Culture adequate volume Performed at [facility] [address] [place], [place] [address]    Culture   Final    NO GROWTH 2 DAYS Performed at [facility] [address] [place], [place] [address]    Report Status PENDING  Incomplete  Resp Panel by RT-PCR (Flu A&B, Covid) Nasopharyngeal Swab     Status: None   Collection Time: 03/08/20  3:40 PM   Specimen: Nasopharyngeal Swab; Nasopharyngeal(NP) swabs in vial transport medium  Result Value Ref Range Status   SARS Coronavirus 2 by RT PCR NEGATIVE NEGATIVE Final    Comment: (NOTE) SARS-CoV-2 target nucleic acids are NOT DETECTED.  The SARS-CoV-2  RNA  is generally detectable in upper respiratory specimens during the acute phase of infection. The lowest concentration of SARS-CoV-2 viral copies this assay can detect is 138 copies/mL. A negative result does not preclude SARS-Cov-2 infection and should not be used as the sole basis for treatment or other patient management decisions. A negative result may occur with  improper specimen collection/handling, submission of specimen other than nasopharyngeal swab, presence of viral mutation(s) within the areas targeted by this assay, and inadequate number of viral copies(<138 copies/mL). A negative result must be combined with clinical observations, patient history, and epidemiological information. The expected result is Negative.  Fact Sheet for Patients:  EntrepreneurPulse.com.au  Fact Sheet for Healthcare Providers:  IncredibleEmployment.be  This test is no t yet approved or cleared by the [place] FDA and  has been authorized for detection and/or diagnosis of SARS-CoV-2 by FDA under an Emergency Use Authorization (EUA). This EUA will remain  in effect (meaning this test can be used) for the duration of the COVID-19 declaration under Section 564(b)(1) of the Act, 21 U.S.C.section 360bbb-3(b)(1), unless the authorization is terminated  or revoked sooner.       Influenza A by PCR NEGATIVE NEGATIVE Final   Influenza B by PCR NEGATIVE NEGATIVE Final    Comment: (NOTE) The Xpert Xpress SARS-CoV-2/FLU/RSV plus assay is intended as an aid in the diagnosis of influenza from Nasopharyngeal swab specimens and should not be used as a sole basis for treatment. Nasal washings and aspirates are unacceptable for Xpert Xpress SARS-CoV-2/FLU/RSV testing.  Fact Sheet for Patients: EntrepreneurPulse.com.au  Fact Sheet for Healthcare Providers: IncredibleEmployment.be  This test is not yet approved or cleared by the  [place] FDA and has been authorized for detection and/or diagnosis of SARS-CoV-2 by FDA under an Emergency Use Authorization (EUA). This EUA will remain in effect (meaning this test can be used) for the duration of the COVID-19 declaration under Section 564(b)(1) of the Act, 21 U.S.C. section 360bbb-3(b)(1), unless the authorization is terminated or revoked.  Performed at [facility], [place] [address] [place], [place] [address]   Urine culture     Status: Abnormal   Collection Time: 03/08/20  4:00 PM   Specimen: Urine, Random  Result Value Ref Range Status   Specimen Description   Final    URINE, RANDOM Performed at [facility] [address], [place], [place] [address]    Special Requests   Final    NONE Performed at [facility], [place] [address] [place], [place] [address]    Culture MULTIPLE SPECIES PRESENT, SUGGEST RECOLLECTION (A)  Final   Report Status 03/10/2020 FINAL  Final  C Difficile Quick Screen w PCR reflex     Status: None   Collection Time: 03/08/20  5:30 PM   Specimen: STOOL  Result Value Ref Range Status   C Diff antigen NEGATIVE NEGATIVE Final   C Diff toxin NEGATIVE NEGATIVE Final   C Diff interpretation No C. difficile detected.  Final    Comment: Performed at [facility], [place] [address], [place],  [address]     Labs: BNP (last 3 results) No results for input(s): BNP in the last 8760 hours. Basic Metabolic Panel: Recent Labs  Lab 03/05/20 1948 03/08/20 1311 03/08/20 1704 03/09/20 0301 03/10/20 0342  NA 134* 138  --  137 137  K 3.9 3.9  --  4.6 4.3  CL 103 103  --  105 106  CO2 23 23  --  25 23  GLUCOSE 97 107*  --  156* 140*  BUN 8 7  --  6 9  CREATININE 0.88 0.85 0.81 0.78 0.72  CALCIUM 9.2 9.3  --  8.5* 8.6*  MG  --   --  1.5*  --   --    Liver Function Tests: Recent Labs  Lab 03/05/20 1948 03/08/20 1311 03/09/20 0301 03/10/20 0342  AST 27  127* 81* 46*  ALT 32 147* 124* 98*  ALKPHOS 91 141* 111 105  BILITOT 0.6 0.6 0.4 0.5  PROT 7.7 8.5* 6.8 6.9  ALBUMIN 4.2 4.2 3.2* 3.3*   Recent Labs  Lab 03/08/20 1311  LIPASE 30   No results for input(s): AMMONIA in the last 168 hours. CBC: Recent Labs  Lab 03/05/20 1948 03/08/20 1311 03/08/20 1704 03/09/20 0810  WBC 9.3 9.7 8.1 9.7  NEUTROABS  --  8.1*  --  8.7*  HGB 12.4 13.9 11.5* 11.4*  HCT 38.6 42.5 35.1* 34.8*  MCV 87.9 85.9 86.5 86.8  PLT 426* 437* 362 393   Cardiac Enzymes: No results for input(s): CKTOTAL, CKMB, CKMBINDEX, TROPONINI in the last 168 hours. BNP: Invalid input(s): POCBNP CBG: Recent Labs  Lab 03/05/20 2028  GLUCAP 86   D-Dimer No results for input(s): DDIMER in the last 72 hours. Hgb A1c No results for input(s): HGBA1C in the last 72 hours. Lipid Profile No results for input(s): CHOL, HDL, LDLCALC, TRIG, CHOLHDL, LDLDIRECT in the last 72 hours. Thyroid function studies Recent Labs    03/08/20 1311  TSH 1.858   Anemia work up No results for input(s): VITAMINB12, FOLATE, FERRITIN, TIBC, IRON, RETICCTPCT in the last 72 hours. Urinalysis    Component Value Date/Time   COLORURINE YELLOW 03/08/2020 1540   APPEARANCEUR CLEAR 03/08/2020 1540   LABSPEC 1.014 03/08/2020 1540   PHURINE 5.0 03/08/2020 1540   GLUCOSEU NEGATIVE 03/08/2020 1540   HGBUR NEGATIVE 03/08/2020 1540   BILIRUBINUR NEGATIVE 03/08/2020 1540   KETONESUR 5 (A) 03/08/2020 1540   PROTEINUR NEGATIVE 03/08/2020 1540   NITRITE NEGATIVE 03/08/2020 1540   LEUKOCYTESUR NEGATIVE 03/08/2020 1540   Sepsis Labs Invalid input(s): PROCALCITONIN,  WBC,  LACTICIDVEN Microbiology Recent Results (from the past 240 hour(s))  SARS CORONAVIRUS 2 (TAT 6-24 HRS) Nasopharyngeal Nasopharyngeal Swab     Status: None   Collection Time: 03/05/20 10:24 PM   Specimen: Nasopharyngeal Swab  Result Value Ref Range Status   SARS Coronavirus 2 NEGATIVE NEGATIVE Final    Comment:  (NOTE) SARS-CoV-2 target nucleic acids are NOT DETECTED.  The SARS-CoV-2 RNA is generally detectable in upper and lower respiratory specimens during the acute phase of infection. Negative results do not preclude SARS-CoV-2 infection, do not rule out co-infections with other pathogens, and should not be used as the sole basis for treatment or other patient management decisions. Negative results must be combined with clinical observations, patient history, and epidemiological information. The expected result is Negative.  Fact Sheet for Patients: SugarRoll.be  Fact Sheet for Healthcare Providers: https://www.woods-mathews.com/  This test is not yet approved or cleared by the [place] FDA and  has been authorized for detection and/or diagnosis of SARS-CoV-2 by FDA under an Emergency Use Authorization (EUA). This EUA will remain  in effect (meaning this test can be used) for the duration of the COVID-19 declaration under Se ction 564(b)(1) of the Act, 21 U.S.C. section 360bbb-3(b)(1), unless the authorization is terminated or revoked sooner.  Performed at [facility], [place],  [place] [address]   Blood culture (routine x 2)     Status: None (Preliminary result)   Collection Time: 03/08/20  1:11 PM   Specimen: BLOOD  Result Value Ref Range Status   Specimen Description   Final    BLOOD RIGHT ANTECUBITAL Performed at [facility] [address], [place], [place] [address]    Special Requests   Final    BOTTLES DRAWN AEROBIC AND ANAEROBIC Blood Culture adequate volume Performed at [facility] [address] [place], [place] [address]    Culture   Final    NO GROWTH 2 DAYS Performed at [facility] [address], [place], [place] [address]    Report Status PENDING  Incomplete  Blood culture (routine x 2)     Status: None (Preliminary result)   Collection Time:  03/08/20  1:13 PM   Specimen: BLOOD RIGHT HAND  Result Value Ref Range Status   Specimen Description   Final    BLOOD RIGHT HAND Performed at [facility], [place] [address], [place], [place] [address]    Special Requests   Final    BOTTLES DRAWN AEROBIC AND ANAEROBIC Blood Culture adequate volume Performed at [facility] [address], [place], [place] [address]    Culture   Final    NO GROWTH 2 DAYS Performed at [facility] [address] [place],  [address]    Report Status PENDING  Incomplete  Resp Panel by RT-PCR (Flu A&B, Covid) Nasopharyngeal Swab     Status: None   Collection Time: 03/08/20  3:40 PM   Specimen: Nasopharyngeal Swab; Nasopharyngeal(NP) swabs in vial transport medium  Result Value Ref Range Status   SARS Coronavirus 2 by RT PCR NEGATIVE NEGATIVE Final    Comment: (NOTE) SARS-CoV-2 target nucleic acids are NOT DETECTED.  The SARS-CoV-2 RNA is generally detectable in upper respiratory specimens during the acute phase of infection. The lowest concentration of SARS-CoV-2 viral copies this assay can detect is 138 copies/mL. A negative result does not preclude SARS-Cov-2 infection and should not be used as the sole basis for treatment or other patient management decisions. A negative result may occur with  improper specimen collection/handling, submission of specimen other than nasopharyngeal swab, presence of viral mutation(s) within the areas targeted by this assay, and inadequate number of viral copies(<138 copies/mL). A negative result must be combined with clinical observations, patient history, and epidemiological information. The expected result is Negative.  Fact Sheet for Patients:  EntrepreneurPulse.com.au  Fact Sheet for Healthcare Providers:  IncredibleEmployment.be  This test is no t yet approved or cleared by the [place] FDA and  has been authorized for detection  and/or diagnosis of SARS-CoV-2 by FDA under an Emergency Use Authorization (EUA). This EUA will remain  in effect (meaning this test can be used) for the duration of the COVID-19 declaration under Section 564(b)(1) of the Act, 21 U.S.C.section 360bbb-3(b)(1), unless the authorization is terminated  or revoked sooner.       Influenza A by PCR NEGATIVE NEGATIVE Final   Influenza B by PCR NEGATIVE NEGATIVE Final    Comment: (NOTE) The Xpert Xpress SARS-CoV-2/FLU/RSV plus assay is intended as an aid in the diagnosis of influenza from Nasopharyngeal swab specimens and should not be used as a sole basis for treatment. Nasal washings and aspirates are unacceptable for Xpert Xpress SARS-CoV-2/FLU/RSV testing.  Fact Sheet for Patients: EntrepreneurPulse.com.au  Fact Sheet for Healthcare Providers:  IncredibleEmployment.be  This test is not yet approved or cleared by the [place] and has been authorized for detection and/or diagnosis of SARS-CoV-2 by FDA under an Emergency Use Authorization (EUA). This EUA will remain in effect (meaning this test can be used) for the duration of the COVID-19 declaration under Section 564(b)(1) of the Act, 21 U.S.C. section 360bbb-3(b)(1), unless the authorization is terminated or revoked.  Performed at [facility], [place] [address] [place], [place] [address]   Urine culture     Status: Abnormal   Collection Time: 03/08/20  4:00 PM   Specimen: Urine, Random  Result Value Ref Range Status   Specimen Description   Final    URINE, RANDOM Performed at [facility] [address] [place], [place] [address]    Special Requests   Final    NONE Performed at [facility], [place] [address], [place], [place] [address]    Culture MULTIPLE SPECIES PRESENT, SUGGEST RECOLLECTION (A)  Final   Report Status 03/10/2020 FINAL  Final  C Difficile Quick Screen w PCR  reflex     Status: None   Collection Time: 03/08/20  5:30 PM   Specimen: STOOL  Result Value Ref Range Status   C Diff antigen NEGATIVE NEGATIVE Final   C Diff toxin NEGATIVE NEGATIVE Final   C Diff interpretation No C. difficile detected.  Final    Comment: Performed at [facility], [place] [address] [place], [place] [address]     Time coordinating discharge: Over 30 minutes  SIGNED:   Darliss Cheney, MD  [facility] 03/10/2020, 10:34 AM  If 7PM-7AM, please contact night-coverage [email or web address]

## 2020-03-10 NOTE — Discharge Instructions (Signed)
Ulcerative Colitis, Adult  Ulcerative colitis is long-term (chronic) inflammation of the large intestine (colon) and rectum. Sores (ulcers) may also form in these areas. Ulcerative colitis, along with a closely related condition called Crohn's disease, is often referred to as inflammatory bowel disease. What are the causes? This condition may be caused by increased activity of the immune system in the intestines. The immune system is the system that protects the body against harmful bacteria, viruses, fungi, and other things that can make you sick. The cause of the increased activity of the immune system is not known. What increases the risk? The following factors may make you more likely to develop this condition:  Being under 15 years old.  Having a family history of ulcerative colitis. What are the signs or symptoms? Symptoms vary depending on how severe the condition is. Common symptoms include:  Rectal bleeding.  Diarrhea, often with blood or pus in the stool. Other symptoms can include:  Pain or cramping in the abdomen.  Fever.  Tiredness (fatigue).  Nausea, loss of appetite, or weight loss.  Rectal pain.  A strong and sudden need to have a bowel movement (bowel urgency).  Anemia.  Yellowing of the skin (jaundice) from liver dysfunction or skin rashes. Symptoms can range from mild to severe. They may come and go. How is this diagnosed? This condition may be diagnosed based on:  Your symptoms and medical history.  A physical exam.  Tests, including: ? Blood tests and stool tests. ? X-rays. ? CT scan. ? MRI. ? Colonoscopy. For this test, a flexible tube is inserted into your anus, and your colon is examined. ? Biopsy. In this test, a tissue sample is taken from your colon and examined under a microscope. How is this treated? There is no cure for this condition, but it can be managed. Treatment depends on the severity of the disease. Treatment for this condition  may include medicines to:  Decrease swelling and inflammation.  Control your immune system.  Treat infections.  Relieve pain.  Control diarrhea. Severe flare-ups may need to be treated at a hospital. Treatment in a hospital may involve:  Resting the bowel. This involves not eating or drinking for a period of time.  Getting medicines through an IV.  Getting fluids and nutrition through: ? An IV. ? A tube that is passed through the nose and into the stomach (nasogastric or NG tube).  Surgery to remove the affected part of the colon. This may be done if other treatments are not helping. This condition increases the risk of colon cancer. Adults with this condition will need to be checked for colon cancer throughout life. Follow these instructions at home: Medicines and vitamins  Take over-the-counter and prescription medicines only as told by your health care provider. Do not take aspirin.  If you were prescribed an antibiotic medicine, take it as told by your health care provider. Do not stop taking the antibiotic even if you start to feel better.  Ask your health care provider if you should take any vitamins or supplements. You may need to take: ? Calcium and vitamin D for bone health. ? Iron to help treat anemia. Lifestyle  Exercise regularly.  Work with your health care provider to manage your condition and educate yourself about your condition.  Do not use any products that contain nicotine or tobacco. These products include cigarettes, chewing tobacco, and vaping devices, such as e-cigarettes. If you need help quitting, ask your health care provider.  If you drink alcohol: ? Limit how much you have to:  0-1 drink a day for women who are not pregnant.  0-2 drinks a day for men. ? Know how much alcohol is in a drink. In the [place] one drink equals one 12 oz bottle of beer (355 mL), one 5 oz glass of wine (148 mL), or one 1 oz glass of hard liquor (44 mL). Eating and  drinking  Keep a food diary. This may help you identify and avoid any foods that trigger your symptoms.  Drink enough fluid to keep your urine pale yellow.  Follow a well-balanced diet as told by your health care provider. This may include: ? Avoiding carbonated drinks. ? Avoiding popcorn, vegetable skins, nuts, and other high-fiber foods. ? Avoiding high-fat foods. ? Eating smaller meals, but more often. ? Limiting sugary drinks. ? Limiting caffeine.  Follow food safety recommendations as told by your health care provider. This may include making sure you: ? Avoid eating raw or undercooked meat, fish, or eggs. ? Do not eat or drink spoiled or expired foods and drinks. General instructions  Wash your hands often with soap and water for at least 20 seconds. If soap and water are not available, use hand sanitizer.  Stay up to date on your vaccinations, including a yearly (annual) flu shot. Ask your health care provider which vaccines you should get.  Have cancer screening tests as told by your health care provider. Ulcerative colitis may place you at increased risk for colon cancer.  Keep all follow-up visits. This is important. Where to find more information You can find some helpful and educational information about ulcerative colitis at the [facility] online here: [employer]inesFuneral.dk Contact a health care provider if:  Your symptoms do not improve or they get worse with treatment.  You continue to lose weight.  You have constant cramps or loose stools.  You develop a new skin rash, skin sores, or eye problems.  You have a fever or chills. Get help right away if:  You have bloody diarrhea.  You have severe bleeding from the rectum.  You feel that your heart is racing.  You have severe pain in your abdomen.  Your abdomen swells (abdominal distension).  Your abdomen is tender to the touch.  You  vomit. Summary  Ulcerative colitis is long-lasting (chronic) inflammation of the large intestine (colon) and rectum. Sores (ulcers) may also form in these areas.  Follow instructions from your health care provider about medicines, lifestyle changes, and eating and drinking.  Contact your health care provider if symptoms do not improve or they get worse with treatment.  Get help right away if you have severe abdominal pain, abdominal swelling, or severe bleeding from the rectum.  Keep all follow-up visits. This is important. This information is not intended to replace advice given to you by your health care provider. Make sure you discuss any questions you have with your health care provider. Document Revised: 09/10/2019 Document Reviewed: 09/10/2019 Elsevier Patient Education  [place].

## 2020-03-11 LAB — ANCA TITERS
Atypical P-ANCA titer: <1:20 {titer}
C-ANCA: <1:20 {titer}
P-ANCA: <1:20 {titer}

## 2020-03-12 LAB — CALPROTECTIN, FECAL: Calprotectin, Fecal: 138 ug/g — ABNORMAL HIGH (ref 0–120)

## 2020-03-13 LAB — CULTURE, BLOOD (ROUTINE X 2)
Culture: NO GROWTH
Culture: NO GROWTH
Special Requests: ADEQUATE
Special Requests: ADEQUATE

## 2020-03-13 LAB — QUANTIFERON-TB GOLD PLUS

## 2020-04-24 ENCOUNTER — Other Ambulatory Visit: Payer: Self-pay

## 2020-04-24 ENCOUNTER — Emergency Department (HOSPITAL_COMMUNITY): Payer: BC Managed Care – PPO

## 2020-04-24 ENCOUNTER — Emergency Department (HOSPITAL_COMMUNITY)
Admission: EM | Admit: 2020-04-24 | Discharge: 2020-04-25 | Disposition: A | Discharge status: Home / Self Care | Payer: BC Managed Care – PPO | Attending: Emergency Medicine | Admitting: Emergency Medicine

## 2020-04-24 ENCOUNTER — Encounter (HOSPITAL_COMMUNITY): Payer: Self-pay | Admitting: Emergency Medicine

## 2020-04-24 DIAGNOSIS — O209 Hemorrhage in early pregnancy, unspecified: Secondary | ICD-10-CM | POA: Diagnosis present

## 2020-04-24 DIAGNOSIS — Z3A01 Less than 8 weeks gestation of pregnancy: Secondary | ICD-10-CM | POA: Diagnosis not present

## 2020-04-24 DIAGNOSIS — O4691 Antepartum hemorrhage, unspecified, first trimester: Secondary | ICD-10-CM | POA: Insufficient documentation

## 2020-04-24 DIAGNOSIS — O418X10 Other specified disorders of amniotic fluid and membranes, first trimester, not applicable or unspecified: Secondary | ICD-10-CM

## 2020-04-24 LAB — TYPE AND SCREEN
ABO/RH(D): O POS
Antibody Screen: NEGATIVE

## 2020-04-24 LAB — CBC WITH DIFFERENTIAL/PLATELET
Abs Immature Granulocytes: 0.06 10*3/uL (ref 0.00–0.07)
Basophils Absolute: 0.1 10*3/uL (ref 0.0–0.1)
Basophils Relative: 1 %
Eosinophils Absolute: 0.3 10*3/uL (ref 0.0–0.5)
Eosinophils Relative: 2 %
HCT: 38.4 % (ref 36.0–46.0)
Hemoglobin: 12.2 g/dL (ref 12.0–15.0)
Immature Granulocytes: 1 %
Lymphocytes Relative: 29 %
Lymphs Abs: 3.5 10*3/uL (ref 0.7–4.0)
MCH: 28 pg (ref 26.0–34.0)
MCHC: 31.8 g/dL (ref 30.0–36.0)
MCV: 88.3 fL (ref 80.0–100.0)
Monocytes Absolute: 0.8 10*3/uL (ref 0.1–1.0)
Monocytes Relative: 6 %
Neutro Abs: 7.3 10*3/uL (ref 1.7–7.7)
Neutrophils Relative %: 61 %
Platelets: 503 10*3/uL — ABNORMAL HIGH (ref 150–400)
RBC: 4.35 MIL/uL (ref 3.87–5.11)
RDW: 17.2 % — ABNORMAL HIGH (ref 11.5–15.5)
WBC: 11.9 10*3/uL — ABNORMAL HIGH (ref 4.0–10.5)
nRBC: 0 % (ref 0.0–0.2)

## 2020-04-24 LAB — I-STAT BETA HCG BLOOD, ED (MC, WL, AP ONLY): I-stat hCG, quantitative: >2000 m[IU]/mL — ABNORMAL HIGH (ref <5)

## 2020-04-24 LAB — COMPREHENSIVE METABOLIC PANEL
ALT: 32 U/L (ref 0–44)
AST: 27 U/L (ref 15–41)
Albumin: 3.9 g/dL (ref 3.5–5.0)
Alkaline Phosphatase: 101 U/L (ref 38–126)
Anion gap: 7 (ref 5–15)
BUN: 12 mg/dL (ref 6–20)
CO2: 24 mmol/L (ref 22–32)
Calcium: 8.9 mg/dL (ref 8.9–10.3)
Chloride: 105 mmol/L (ref 98–111)
Creatinine, Ser: 0.72 mg/dL (ref 0.44–1.00)
GFR, Estimated: >60 mL/min (ref >60)
Glucose, Bld: 105 mg/dL — ABNORMAL HIGH (ref 70–99)
Potassium: 4.1 mmol/L (ref 3.5–5.1)
Sodium: 136 mmol/L (ref 135–145)
Total Bilirubin: 0.5 mg/dL (ref 0.3–1.2)
Total Protein: 7.6 g/dL (ref 6.5–8.1)

## 2020-04-24 LAB — HCG, QUANTITATIVE, PREGNANCY: hCG, Beta Chain, Quant, S: 7857 m[IU]/mL — ABNORMAL HIGH (ref <5)

## 2020-04-24 NOTE — ED Triage Notes (Signed)
Emergency Medicine Provider Triage Evaluation Note  Laura Vaughn , a 25 y.o. female  was evaluated in triage.  Pt complains of abdominal cramping and vaginal bleeding.  Had 2+ home pregnancy test 2 weeks ago.  She believes she may be about [redacted] weeks along.  This is her third pregnancy.  Has not yet received prenatal care.  Bleeding and cramping started today.  Denies any vomiting, diarrhea.  Review of Systems  Positive: Vaginal bleeding, cramping Negative: Vomiting, diarrhea  Physical Exam  BP 126/82 (BP Location: Right Arm)   Pulse (!) 112   Temp 98.5 F (36.9 C) (Oral)   Resp 18   Ht 5' 8"  (1.727 m)   Wt 71.7 kg   SpO2 100%   BMI 24.02 kg/m  Gen:   Awake, no distress   HEENT:  Atraumatic  Resp:  Normal effort  Cardiac:  Normal rate  Abd:   Nondistended, nontender  MSK:   Moves extremities without difficulty  Neuro:  Speech clear   Medical Decision Making  Medically screening exam initiated at 9:26 PM.  Appropriate orders placed.  Laura Vaughn was informed that the remainder of the evaluation will be completed by another provider, this initial triage assessment does not replace that evaluation, and the importance of remaining in the ED until their evaluation is complete.  Clinical Impression  25 year old female presenting to the ED for abdominal cramping and vaginal bleeding in the setting of early pregnancy.  There is no documented pregnancy test here.  I will obtain a quantitative hCG as well as other lab work.  Have ordered pelvic ultrasound as well.   Delia Heady, PA-C 04/24/20 2131

## 2020-04-24 NOTE — ED Triage Notes (Signed)
Pt reports that she had 2 positive pregnancy tests 2 weeks ago. A few hours ago, she started experiencing severe abdominal cramping and vaginal bleeding. A&Ox4. Ambulatory.

## 2020-04-25 DIAGNOSIS — O4691 Antepartum hemorrhage, unspecified, first trimester: Secondary | ICD-10-CM | POA: Diagnosis not present

## 2020-04-25 LAB — URINALYSIS, ROUTINE W REFLEX MICROSCOPIC
Bilirubin Urine: NEGATIVE
Glucose, UA: NEGATIVE mg/dL
Ketones, ur: NEGATIVE mg/dL
Leukocytes,Ua: NEGATIVE
Nitrite: NEGATIVE
Protein, ur: NEGATIVE mg/dL
Specific Gravity, Urine: 1.02 (ref 1.005–1.030)
pH: 5 (ref 5.0–8.0)

## 2020-04-25 LAB — WET PREP, GENITAL
Clue Cells Wet Prep HPF POC: NONE SEEN
Sperm: NONE SEEN
Trich, Wet Prep: NONE SEEN
Yeast Wet Prep HPF POC: NONE SEEN

## 2020-04-25 NOTE — ED Provider Notes (Signed)
[facility] DEPT Provider Note  CSN: [id number] Arrival date & time: 04/24/20 2054  Chief Complaint(s) Vaginal Bleeding  HPI Laura Vaughn is a 25 y.o. female G3 P2 who presents to the emergency department for vaginal bleeding patient reports that she is likely [redacted] weeks pregnant.  She reports that she began having abdominal cramping earlier this afternoon and noted the bleeding this evening.  Cramping is intermittent in nature and lower abdomen.  Not consistent with her ulcerative colitis.  She denies any hematochezia or melena.  Currently denies any abdominal cramping or discomfort.  Denies any nausea or vomiting.  No urinary symptoms.  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  HPI  Past Medical History Past Medical History:  Diagnosis Date  . ADHD (attention deficit hyperactivity disorder)   . Anemia   . History of necrotizing fasciitis    vulvar and vaginal after last delivery  . Ulcerative colitis [facility])    Patient Active Problem List   Diagnosis Date Noted  . Severe sepsis ([place]) 03/08/2020  . Ulcerative colitis, acute ([place]) 03/08/2020  . Elevated LFTs 03/08/2020  . S/P cesarean section 06/28/2019  . Indication for care in labor or delivery 06/28/2019  . ADD (attention deficit disorder) without hyperactivity 04/11/2018  . Crohn's disease without complication ([place]) 63/81/7711  . 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 Authorizing Provider  hydrOXYzine (ATARAX/VISTARIL) 10 MG tablet Take 10 mg by mouth 3 (three) times daily as needed for anxiety. 02/20/20  Yes [provider]  mesalamine (LIALDA) 1.2 g EC tablet Take 4.8 g by mouth daily. 04/20/20  Yes [provider]  Prenatal Vit-Fe Fumarate-FA (GNP PRENATAL PO) Take 1 tablet by mouth daily.   Yes [provider]   traZODone (DESYREL) 100 MG tablet Take 100 mg by mouth at bedtime. 02/29/20  Yes [provider]  venlafaxine XR (EFFEXOR-XR) 75 MG 24 hr capsule Take 75 mg by mouth daily. 02/29/20  Yes [provider]  predniSONE (DELTASONE) 10 MG tablet Take 40 mg (4 tabs) for 7 days, followed by 30 mg (3 tabs) for 7 days starting 3/1//2022, followed by 20 mg (2 tabs) for 7 days starting 03/24/2020, followed by 10 mg p.o. daily for 7 days Patient not taking: Reported on 04/24/2020 03/10/20   Darliss Cheney, MD                                                                                                                                    Past Surgical History Past Surgical History:  Procedure Laterality Date  . ABDOMINAL DEBRIDEMENT  2018   x7  . CESAREAN SECTION N/A 06/28/2019   Procedure: CESAREAN SECTION;  Surgeon: Cheri Fowler, MD;  Location: [facility] LD [facility]S;  Service: Obstetrics;  Laterality: N/A;  request RNFA  .  COLON SURGERY    . OSTOMY  2018  . ostomy reversal  2020  . VAGINA SURGERY     Family History Family History  Problem Relation Age of Onset  . ADD / ADHD Sister   . Bipolar disorder Paternal Grandmother   . ADD / ADHD Sister   . Diabetes Father     Social History Social History   Tobacco Use  . Smoking status: Never Smoker  . Smokeless tobacco: Never Used  Vaping Use  . Vaping Use: Never used  Substance Use Topics  . Alcohol use: Never  . Drug use: Never   Allergies Patient has no known allergies.  Review of Systems Review of Systems All other systems are reviewed and are negative for acute change except as noted in the HPI  Physical Exam Vital Signs  I have reviewed the triage vital signs BP 95/70   Pulse 88   Temp 98.6 F (37 C) (Oral)   Resp 18   Ht 5' 8"  (1.727 m)   Wt 71.7 kg   SpO2 99%   BMI 24.02 kg/m   Physical Exam Vitals reviewed. Exam conducted with a chaperone present.  Constitutional:      General: She is not in acute distress.     Appearance: She is well-developed. She is not diaphoretic.  HENT:     Head: Normocephalic and atraumatic.     Right Ear: External ear normal.     Left Ear: External ear normal.     Nose: Nose normal.  Eyes:     General: No scleral icterus.    Conjunctiva/sclera: Conjunctivae normal.  Neck:     Trachea: Phonation normal.  Cardiovascular:     Rate and Rhythm: Normal rate and regular rhythm.  Pulmonary:     Effort: Pulmonary effort is normal. No respiratory distress.     Breath sounds: No stridor.  Abdominal:     General: There is no distension.     Tenderness: There is no abdominal tenderness. There is no guarding or rebound.  Genitourinary:    Cervix: Cervical bleeding present. No discharge, friability, lesion or erythema.     Adnexa:        Right: No tenderness.         Left: No tenderness.       Comments: Vulvar scarring.  Cervical Os closed. Musculoskeletal:        General: Normal range of motion.     Cervical back: Normal range of motion.  Neurological:     Mental Status: She is alert and oriented to person, place, and time.  Psychiatric:        Behavior: Behavior normal.     ED Results and Treatments Labs (all labs ordered are listed, but only abnormal results are displayed) Labs Reviewed  WET PREP, GENITAL - Abnormal; Notable for the following components:      Result Value   WBC, Wet Prep HPF POC FEW (*)    All other components within normal limits  COMPREHENSIVE METABOLIC PANEL - Abnormal; Notable for the following components:   Glucose, Bld 105 (*)    All other components within normal limits  CBC WITH DIFFERENTIAL/PLATELET - Abnormal; Notable for the following components:   WBC 11.9 (*)    RDW 17.2 (*)    Platelets 503 (*)    All other components within normal limits  HCG, QUANTITATIVE, PREGNANCY - Abnormal; Notable for the following components:   hCG, Beta Levada Dy, S 7,857 (*)  All other components within normal limits  URINALYSIS, ROUTINE W  REFLEX MICROSCOPIC - Abnormal; Notable for the following components:   Hgb urine dipstick LARGE (*)    Bacteria, UA RARE (*)    All other components within normal limits  I-STAT BETA HCG BLOOD, ED ([facility], [facility], [facility] ONLY) - Abnormal; Notable for the following components:   I-stat hCG, quantitative >2,000.0 (*)    All other components within normal limits  URINE CULTURE  TYPE AND SCREEN  GC/CHLAMYDIA PROBE AMP ([place]) NOT AT [facility]                                                                                                                         EKG  EKG Interpretation  Date/Time:    Ventricular Rate:    PR Interval:    QRS Duration:   QT Interval:    QTC Calculation:   R Axis:     Text Interpretation:        Radiology US OB Comp < 14 Wks  Result Date: 04/24/2020 CLINICAL DATA:  Pregnant patient in first-trimester pregnancy with abdominal cramping and vaginal bleeding. EXAM: OBSTETRIC <14 WK [place] AND TRANSVAGINAL OB US TECHNIQUE: Both transabdominal and transvaginal ultrasound examinations were performed for complete evaluation of the gestation as well as the maternal uterus, adnexal regions, and pelvic cul-de-sac. Transvaginal technique was performed to assess early pregnancy. COMPARISON:  None. FINDINGS: Intrauterine gestational sac: Single Yolk sac:  Visualized. Embryo:  Not Visualized. Cardiac Activity: Not Visualized. MSD: 9.4 mm   5 w   5 d Subchorionic hemorrhage: Moderate measuring 2.6 x 2.8 x 1.1 cm, posterior and inferior to the gestational sac. Maternal uterus/adnexae: Both ovaries are visualized and are normal. There is a corpus luteal cyst on the left. Blood flow is noted to both ovaries. No adnexal mass or pelvic free fluid. IMPRESSION: 1. Intrauterine gestational sac contains a yolk sac but no fetal pole or cardiac activity demonstrated. Recommend continued trending of beta HCG and sonographic follow-up as indicated. 2. Moderate subchorionic hemorrhage. 3. No adnexal mass or  findings of ectopic pregnancy. Electronically Signed   By: Keith Rake M.D.   On: 04/24/2020 23:58   US OB Transvaginal  Result Date: 04/24/2020 CLINICAL DATA:  Pregnant patient in first-trimester pregnancy with abdominal cramping and vaginal bleeding. EXAM: OBSTETRIC <14 WK [place] AND TRANSVAGINAL OB US TECHNIQUE: Both transabdominal and transvaginal ultrasound examinations were performed for complete evaluation of the gestation as well as the maternal uterus, adnexal regions, and pelvic cul-de-sac. Transvaginal technique was performed to assess early pregnancy. COMPARISON:  None. FINDINGS: Intrauterine gestational sac: Single Yolk sac:  Visualized. Embryo:  Not Visualized. Cardiac Activity: Not Visualized. MSD: 9.4 mm   5 w   5 d Subchorionic hemorrhage: Moderate measuring 2.6 x 2.8 x 1.1 cm, posterior and inferior to the gestational sac. Maternal uterus/adnexae: Both ovaries are visualized and are normal. There is a corpus luteal cyst on the left. Blood flow is noted to  both ovaries. No adnexal mass or pelvic free fluid. IMPRESSION: 1. Intrauterine gestational sac contains a yolk sac but no fetal pole or cardiac activity demonstrated. Recommend continued trending of beta HCG and sonographic follow-up as indicated. 2. Moderate subchorionic hemorrhage. 3. No adnexal mass or findings of ectopic pregnancy. Electronically Signed   By: Keith Rake M.D.   On: 04/24/2020 23:58    Pertinent labs & imaging results that were available during my care of the patient were reviewed by me and considered in my medical decision making (see chart for details).  Medications Ordered in ED Medications - No data to display                                                                                                                                  Procedures Procedures  (including critical care time)  Medical Decision Making / ED Course I have reviewed the nursing notes for this encounter and the patient's prior  records (if available in EHR or on provided paperwork).   Laura Vaughn was evaluated in Emergency Department on 04/25/2020 for the symptoms described in the history of present illness. She was evaluated in the context of the global COVID-19 pandemic, which necessitated consideration that the patient might be at risk for infection with the SARS-CoV-2 virus that causes COVID-19. Institutional protocols and algorithms that pertain to the evaluation of patients at risk for COVID-19 are in a state of rapid change based on information released by regulatory bodies including the CDC and federal and state organizations. These policies and algorithms were followed during the patient's care in the ED.  Patient presents with vaginal bleeding in the first trimester. Pregnancy confirmed. Ultrasound notable for intrauterine sac and yolk.  At approximately 5 weeks 5 days. No evidence of ectopic pregnancy.  Did reveal subchorionic hemorrhage. Cervical os is closed and patient is having some vaginal bleeding. Discussed threatened abortion. Instructed to follow-up closely with OB/GYN for beta-hCG trending and likely repeat ultrasound.      Final Clinical Impression(s) / ED Diagnoses Final diagnoses:  Vaginal bleeding in pregnancy, first trimester  Subchorionic hematoma in first trimester, single or unspecified fetus    The patient appears reasonably screened and/or stabilized for discharge and I doubt any other medical condition or other [facility] requiring further screening, evaluation, or treatment in the ED at this time prior to discharge. Safe for discharge with strict return precautions.  Disposition: Discharge  Condition: Good  I have discussed the results, Dx and Tx plan with the patient/family who expressed understanding and agree(s) with the plan. Discharge instructions discussed at length. The patient/family was given strict return precautions who verbalized understanding of the instructions. No further  questions at time of discharge.    ED Discharge Orders    None        Follow Up: Obstetrician  Call  to schedule an appointment for close follow up     This  chart was dictated using voice recognition software.  Despite best efforts to proofread,  errors can occur which can change the documentation meaning.   Fatima Blank, MD 04/25/20 [phone]

## 2020-04-26 LAB — URINE CULTURE: Culture: 60000 — AB

## 2020-04-27 ENCOUNTER — Telehealth: Payer: Self-pay | Admitting: *Deleted

## 2020-04-27 LAB — GC/CHLAMYDIA PROBE AMP (~~LOC~~) NOT AT ARMC
Chlamydia: NEGATIVE
Comment: NEGATIVE
Comment: NORMAL
Neisseria Gonorrhea: NEGATIVE

## 2020-04-27 NOTE — Progress Notes (Signed)
ED Antimicrobial Stewardship Positive Culture Follow Up   Laura Vaughn is an 25 y.o. female who presented to [facility] on 04/24/2020 with a chief complaint of  Chief Complaint  Patient presents with  . Vaginal Bleeding    Recent Results (from the past 720 hour(s))  Urine culture     Status: Abnormal   Collection Time: 04/24/20 11:00 PM   Specimen: Urine, Random  Result Value Ref Range Status   Specimen Description   Final    URINE, RANDOM Performed at [facility] [address] [place], [place] [address]    Special Requests   Final    NONE Performed at [facility], [place] [address] [place], [place] [address]    Culture (A)  Final    60,000 COLONIES/mL LACTOBACILLUS SPECIES Standardized susceptibility testing for this organism is not available. Performed at [facility], [place] [address], [place], [place] [address]    Report Status 04/26/2020 FINAL  Final  Wet prep, genital     Status: Abnormal   Collection Time: 04/25/20  1:00 AM   Specimen: PATH Cytology Cervicovaginal Ancillary Only  Result Value Ref Range Status   Yeast Wet Prep HPF POC NONE SEEN NONE SEEN Final   Trich, Wet Prep NONE SEEN NONE SEEN Final   Clue Cells Wet Prep HPF POC NONE SEEN NONE SEEN Final   WBC, Wet Prep HPF POC FEW (A) NONE SEEN Final   Sperm NONE SEEN  Final    Comment: Performed at [facility], [place] [address] [place], [place] [address]    [x]  Patient discharged originally without antimicrobial agent and treatment is now indicated  New antibiotic prescription: Amoxicillin 500 mg three times a day for 3 days   ED Provider: Lorre Munroe, MD   Marliss Czar [facility] 04/27/2020, 8:46 AM Clinical Pharmacist Monday - Friday phone -  [phone] Saturday - Sunday phone - [phone]

## 2020-04-27 NOTE — Telephone Encounter (Signed)
Post ED Visit - Positive Culture Follow-up: Successful Patient Follow-Up  Culture assessed and recommendations reviewed by:  []  Elenor Quinones, Pharm.D. []  Heide Guile, Pharm.D., BCPS AQ-ID []  Parks Neptune, Pharm.D., BCPS []  Alycia Rossetti, Pharm.D., BCPS []  Kvion Shapley, Pharm.D., BCPS, AAHIVP []  Legrand Como, Pharm.D., BCPS, AAHIVP []  Salome Arnt, PharmD, BCPS []  Johnnette Gourd, PharmD, BCPS []  Hughes Better, PharmD, BCPS []  Leeroy Cha, PharmD  Positive urine culture  [x]  Patient discharged without antimicrobial prescription and treatment is now indicated []  Organism is resistant to prescribed ED discharge antimicrobial []  Patient with positive blood cultures  Changes discussed with ED provider Lorre Munroe, MD New antibiotic prescription Amoxicillin 581m PO TID x 3 days Called to [facility] Contact[facility] patient, date 04/27/2020, time [place]/11/2020, 9:03 AM

## 2020-05-01 ENCOUNTER — Other Ambulatory Visit: Payer: Self-pay | Admitting: Gynecology

## 2020-05-21 LAB — OB RESULTS CONSOLE GC/CHLAMYDIA
Chlamydia: NEGATIVE
Gonorrhea: NEGATIVE

## 2020-05-21 LAB — OB RESULTS CONSOLE HEPATITIS B SURFACE ANTIGEN: Hepatitis B Surface Ag: NEGATIVE

## 2020-05-21 LAB — OB RESULTS CONSOLE ABO/RH: RH Type: POSITIVE

## 2020-05-21 LAB — OB RESULTS CONSOLE RPR: RPR: NONREACTIVE

## 2020-05-21 LAB — OB RESULTS CONSOLE ANTIBODY SCREEN: Antibody Screen: NEGATIVE

## 2020-05-21 LAB — OB RESULTS CONSOLE HIV ANTIBODY (ROUTINE TESTING): HIV: NONREACTIVE

## 2020-05-21 LAB — OB RESULTS CONSOLE RUBELLA ANTIBODY, IGM: Rubella: IMMUNE

## 2020-05-21 LAB — OB RESULTS CONSOLE VARICELLA ZOSTER ANTIBODY, IGG: Varicella: IMMUNE

## 2020-05-21 LAB — HEPATITIS C ANTIBODY: HCV Ab: NEGATIVE

## 2020-08-04 ENCOUNTER — Other Ambulatory Visit: Payer: Self-pay | Admitting: Obstetrics and Gynecology

## 2020-08-04 DIAGNOSIS — Z363 Encounter for antenatal screening for malformations: Secondary | ICD-10-CM

## 2020-08-25 ENCOUNTER — Other Ambulatory Visit: Payer: Self-pay

## 2020-08-25 ENCOUNTER — Ambulatory Visit: Payer: BC Managed Care – PPO | Attending: Obstetrics | Admitting: Obstetrics

## 2020-08-25 ENCOUNTER — Ambulatory Visit: Payer: BC Managed Care – PPO | Admitting: *Deleted

## 2020-08-25 ENCOUNTER — Other Ambulatory Visit: Payer: Self-pay | Admitting: *Deleted

## 2020-08-25 ENCOUNTER — Ambulatory Visit: Payer: BC Managed Care – PPO | Attending: Obstetrics and Gynecology

## 2020-08-25 VITALS — BP 100/63 | HR 80

## 2020-08-25 DIAGNOSIS — Z3689 Encounter for other specified antenatal screening: Secondary | ICD-10-CM

## 2020-08-25 DIAGNOSIS — O162 Unspecified maternal hypertension, second trimester: Secondary | ICD-10-CM

## 2020-08-25 DIAGNOSIS — K5090 Crohn's disease, unspecified, without complications: Secondary | ICD-10-CM

## 2020-08-25 DIAGNOSIS — Z3A23 23 weeks gestation of pregnancy: Secondary | ICD-10-CM

## 2020-08-25 DIAGNOSIS — O34219 Maternal care for unspecified type scar from previous cesarean delivery: Secondary | ICD-10-CM

## 2020-08-25 DIAGNOSIS — O99612 Diseases of the digestive system complicating pregnancy, second trimester: Secondary | ICD-10-CM | POA: Diagnosis not present

## 2020-08-25 DIAGNOSIS — Z363 Encounter for antenatal screening for malformations: Secondary | ICD-10-CM | POA: Insufficient documentation

## 2020-08-25 DIAGNOSIS — O24912 Unspecified diabetes mellitus in pregnancy, second trimester: Secondary | ICD-10-CM

## 2020-08-26 NOTE — Progress Notes (Signed)
MFM Note  Laura Vaughn was seen for a detailed fetal anatomy scan due to a history of inflammatory bowel disease that is currently treated with Stelara.  She is being followed by a gastroenterologist. Her first pregnancy was complicated by necrotizing fasciitis in her vagina that required multiple grafts to her vagina including a colostomy. She had a cesarean delivery for her second pregnancy which was relatively uncomplicated. She reports that she is also experiencing a rapid heartbeat of unknown cause.  She has not had any thyroid function tests drawn in her current pregnancy.  She denies any other significant past medical history and denies any problems in her current pregnancy.    She has declined all screening tests for fetal aneuploidy in her current pregnancy.  She was informed that the fetal growth and amniotic fluid level were appropriate for her gestational age.   There were no obvious fetal anomalies noted on today's ultrasound exam. However, today's exam was limited due to the fetal position.  The patient was informed that anomalies may be missed due to technical limitations. If the fetus is in a suboptimal position or maternal habitus is increased, visualization of the fetus in the maternal uterus may be impaired.  The patient was advised to continue close follow-up with her gastroenterologist to help with the management of her inflammatory bowel disease. Although data regarding treatment with Stelara in pregnancy are limited, most of the reports have not indicated any increased risk to the fetus.  Therefore, she may continue Stelara treatment as recommended by her gastroenterologist. We will continue to follow her with growth ultrasounds throughout her pregnancy.    For the work-up of her tachycardia, she should have thyroid function tests drawn at her next visit.    A follow-up exam was scheduled in 4 weeks to complete the views of the fetal anatomy and to assess the fetal  growth.  A total of 30 minutes was spent counseling and coordinating the care for this patient.  Greater than 50% of the time was spent in direct face-to-face contact.  Recommendations:  Continue Stelara as recommended by her gastroenterologist Thyroid function tests to be drawn at her next prenatal visit Serial growth ultrasounds Repeat cesarean delivery at 39 weeks

## 2020-09-02 DIAGNOSIS — R42 Dizziness and giddiness: Secondary | ICD-10-CM | POA: Diagnosis not present

## 2020-09-02 DIAGNOSIS — R5383 Other fatigue: Secondary | ICD-10-CM | POA: Diagnosis not present

## 2020-09-02 DIAGNOSIS — E612 Magnesium deficiency: Secondary | ICD-10-CM | POA: Diagnosis not present

## 2020-09-02 DIAGNOSIS — K5190 Ulcerative colitis, unspecified, without complications: Secondary | ICD-10-CM | POA: Diagnosis not present

## 2020-09-22 ENCOUNTER — Ambulatory Visit: Payer: Self-pay

## 2020-09-22 ENCOUNTER — Ambulatory Visit: Payer: BC Managed Care – PPO

## 2020-09-23 DIAGNOSIS — Z3689 Encounter for other specified antenatal screening: Secondary | ICD-10-CM | POA: Diagnosis not present

## 2020-10-13 ENCOUNTER — Ambulatory Visit: Payer: BC Managed Care – PPO | Admitting: Dietician

## 2020-11-25 DIAGNOSIS — Z113 Encounter for screening for infections with a predominantly sexual mode of transmission: Secondary | ICD-10-CM | POA: Diagnosis not present

## 2020-11-25 DIAGNOSIS — Z3685 Encounter for antenatal screening for Streptococcus B: Secondary | ICD-10-CM | POA: Diagnosis not present

## 2020-11-25 LAB — OB RESULTS CONSOLE GBS: GBS: NEGATIVE

## 2020-11-27 ENCOUNTER — Inpatient Hospital Stay (HOSPITAL_COMMUNITY)
Admission: AD | Admit: 2020-11-27 | Discharge: 2020-11-28 | Disposition: A | Discharge status: Home / Self Care | Payer: BC Managed Care – PPO | Attending: Obstetrics and Gynecology | Admitting: Obstetrics and Gynecology

## 2020-11-27 ENCOUNTER — Encounter (HOSPITAL_COMMUNITY): Payer: Self-pay | Admitting: Obstetrics and Gynecology

## 2020-11-27 DIAGNOSIS — O212 Late vomiting of pregnancy: Secondary | ICD-10-CM | POA: Insufficient documentation

## 2020-11-27 DIAGNOSIS — Z3A36 36 weeks gestation of pregnancy: Secondary | ICD-10-CM | POA: Diagnosis not present

## 2020-11-27 DIAGNOSIS — O479 False labor, unspecified: Secondary | ICD-10-CM

## 2020-11-27 DIAGNOSIS — O4703 False labor before 37 completed weeks of gestation, third trimester: Secondary | ICD-10-CM | POA: Diagnosis not present

## 2020-11-27 DIAGNOSIS — O99613 Diseases of the digestive system complicating pregnancy, third trimester: Secondary | ICD-10-CM | POA: Diagnosis not present

## 2020-11-27 DIAGNOSIS — O219 Vomiting of pregnancy, unspecified: Secondary | ICD-10-CM

## 2020-11-27 DIAGNOSIS — Z20822 Contact with and (suspected) exposure to covid-19: Secondary | ICD-10-CM | POA: Diagnosis not present

## 2020-11-27 DIAGNOSIS — K529 Noninfective gastroenteritis and colitis, unspecified: Secondary | ICD-10-CM | POA: Diagnosis not present

## 2020-11-27 LAB — COMPREHENSIVE METABOLIC PANEL
ALT: 30 U/L (ref 0–44)
AST: 45 U/L — ABNORMAL HIGH (ref 15–41)
Albumin: 2.7 g/dL — ABNORMAL LOW (ref 3.5–5.0)
Alkaline Phosphatase: 199 U/L — ABNORMAL HIGH (ref 38–126)
Anion gap: 11 (ref 5–15)
BUN: 7 mg/dL (ref 6–20)
CO2: 18 mmol/L — ABNORMAL LOW (ref 22–32)
Calcium: 8.3 mg/dL — ABNORMAL LOW (ref 8.9–10.3)
Chloride: 103 mmol/L (ref 98–111)
Creatinine, Ser: 0.69 mg/dL (ref 0.44–1.00)
GFR, Estimated: >60 mL/min (ref >60)
Glucose, Bld: 76 mg/dL (ref 70–99)
Potassium: 4.5 mmol/L (ref 3.5–5.1)
Sodium: 132 mmol/L — ABNORMAL LOW (ref 135–145)
Total Bilirubin: 1.3 mg/dL — ABNORMAL HIGH (ref 0.3–1.2)
Total Protein: 6.8 g/dL (ref 6.5–8.1)

## 2020-11-27 LAB — CBC
HCT: 39.6 % (ref 36.0–46.0)
Hemoglobin: 13 g/dL (ref 12.0–15.0)
MCH: 30.2 pg (ref 26.0–34.0)
MCHC: 32.8 g/dL (ref 30.0–36.0)
MCV: 91.9 fL (ref 80.0–100.0)
Platelets: 479 10*3/uL — ABNORMAL HIGH (ref 150–400)
RBC: 4.31 MIL/uL (ref 3.87–5.11)
RDW: 13.7 % (ref 11.5–15.5)
WBC: 18.5 10*3/uL — ABNORMAL HIGH (ref 4.0–10.5)
nRBC: 0 % (ref 0.0–0.2)

## 2020-11-27 LAB — RESP PANEL BY RT-PCR (FLU A&B, COVID) ARPGX2
Influenza A by PCR: NEGATIVE
Influenza B by PCR: NEGATIVE
SARS Coronavirus 2 by RT PCR: NEGATIVE

## 2020-11-27 MED ORDER — LACTATED RINGERS IV BOLUS
1000.0000 mL | Freq: Once | INTRAVENOUS | Status: AC
  Administered 2020-11-27: 1000 mL via INTRAVENOUS

## 2020-11-27 MED ORDER — SODIUM CHLORIDE 0.9 % IV SOLN
12.5000 mg | Freq: Once | INTRAVENOUS | Status: AC
  Administered 2020-11-27: 12.5 mg via INTRAVENOUS
  Filled 2020-11-27: qty 0.5

## 2020-11-27 MED ORDER — LACTATED RINGERS IV BOLUS
1000.0000 mL | INTRAVENOUS | Status: AC
  Administered 2020-11-27: 1000 mL via INTRAVENOUS

## 2020-11-27 MED ORDER — ONDANSETRON HCL 4 MG/2ML IJ SOLN
4.0000 mg | Freq: Once | INTRAMUSCULAR | Status: AC
  Administered 2020-11-27: 4 mg via INTRAVENOUS
  Filled 2020-11-27: qty 2

## 2020-11-27 NOTE — MAU Provider Note (Signed)
Chief Complaint:  Contractions and Nausea   Event Date/Time   First Provider Initiated Contact with Patient 11/27/20 2139      HPI: Laura Vaughn is a 25 y.o. G3P2002 at 28w2dwho presents to maternity admissions reporting onset of nausea today, contractions 1-2 per hour, and 1 episode of vomiting today. She has not tried any medications. She has hx of postpartum necrotizing fasciitis and cesarean x 1 with planned cesarean this pregnancy.  She reports her family all had the flu 1.5 weeks ago but fully recovered. She denies any respiratory symptoms or fever/chills today. She reports good fetal movement, denies LOF, vaginal bleeding, vaginal itching/burning, urinary symptoms, h/a, or fever/chills.      HPI  Past Medical History: Past Medical History:  Diagnosis Date   ADHD (attention deficit hyperactivity disorder)    Anemia    History of necrotizing fasciitis    vulvar and vaginal after last delivery   [place]     Past obstetric history: OB History  Gravida Para Term Preterm AB Living  3 2 2     2   SAB IAB Ectopic Multiple Live Births        0 2    # Outcome Date GA Lbr Len/2nd Weight Sex Delivery Anes PTL Lv  3 Current           2 Term 06/28/19 [id number] g F CS-LTranv Spinal  LIV  1 Term 09/24/16    [address] LIV    Past Surgical History: Past Surgical History:  Procedure Laterality Date   ABDOMINAL DEBRIDEMENT  2018   x7   CESAREAN SECTION N/A 06/28/2019   Procedure: CESAREAN SECTION;  Surgeon: MeCheri FowlerMD;  Location: [facility] LD [facility];  Service: Obstetrics;  Laterality: N/A;  request RNFA   COLON SURGERY     OSTOMY  2018   ostomy reversal  2020   VAGINA SURGERY      Family History: Family History  Problem Relation Age of Onset   ADD / ADHD Sister    Bipolar disorder Paternal Grandmother    ADD / ADHD Sister    Diabetes Father     Social History: Social History   Tobacco Use   Smoking status: Never   Smokeless tobacco: Never  Vaping  Use   Vaping Use: Never used  Substance Use Topics   Alcohol use: Never   Drug use: Never    Allergies: No Known Allergies  Meds:  Medications Prior to Admission  Medication Sig Dispense Refill Last Dose   Prenatal Vit-Fe Fumarate-FA (GNP PRENATAL PO) Take 1 tablet by mouth daily.   11/27/2020   Ustekinumab (STELARA Bixby) Inject into the skin.   Past Month   hydrOXYzine (ATARAX/VISTARIL) 10 MG tablet Take 10 mg by mouth 3 (three) times daily as needed for anxiety. (Patient not taking: Reported on 08/25/2020)      mesalamine (LIALDA) 1.2 g EC tablet Take 4.8 g by mouth daily. (Patient not taking: Reported on 08/25/2020)      predniSONE (DELTASONE) 10 MG tablet Take 40 mg (4 tabs) for 7 days, followed by 30 mg (3 tabs) for 7 days starting 3/1//2022, followed by 20 mg (2 tabs) for 7 days starting 03/24/2020, followed by 10 mg p.o. daily for 7 days (Patient not taking: No sig reported) 70 tablet 0    traZODone (DESYREL) 100 MG tablet Take 100 mg by mouth at bedtime. (Patient not taking: Reported on 08/25/2020)  venlafaxine XR (EFFEXOR-XR) 75 MG 24 hr capsule Take 75 mg by mouth daily. (Patient not taking: Reported on 08/25/2020)       ROS:  Review of Systems  Constitutional:  Negative for chills, fatigue and fever.  Eyes:  Negative for visual disturbance.  Respiratory:  Negative for shortness of breath.   Cardiovascular:  Negative for chest pain.  Gastrointestinal:  Positive for abdominal pain, nausea and vomiting.  Genitourinary:  Negative for difficulty urinating, dysuria, flank pain, pelvic pain, vaginal bleeding, vaginal discharge and vaginal pain.  Neurological:  Negative for dizziness and headaches.  Psychiatric/Behavioral: Negative.      I have reviewed patient's Past Medical Hx, Surgical Hx, Family Hx, Social Hx, medications and allergies.   Physical Exam  Patient Vitals for the past 24 hrs:  BP Temp Temp src Pulse Resp SpO2 Height Weight  11/28/20 0040 -- -- -- -- -- 99 % -- --   11/28/20 0035 -- -- -- -- -- 98 % -- --  11/28/20 0030 -- -- -- -- -- 98 % -- --  11/28/20 0025 (!) 90/51 -- -- 99 -- 98 % -- --  11/28/20 0020 -- -- -- -- -- 98 % -- --  11/28/20 0015 -- -- -- -- -- 98 % -- --  11/28/20 0010 -- -- -- -- -- 99 % -- --  11/28/20 0005 -- -- -- -- -- 98 % -- --  11/28/20 0000 -- -- -- -- -- 97 % -- --  11/27/20 2355 -- -- -- -- -- 97 % -- --  11/27/20 2350 -- -- -- -- -- 97 % -- --  11/27/20 2345 -- -- -- -- -- 97 % -- --  11/27/20 2340 -- -- -- -- -- 97 % -- --  11/27/20 2335 -- -- -- -- -- 97 % -- --  11/27/20 2330 -- -- -- -- -- 99 % -- --  11/27/20 2325 -- -- -- -- -- 98 % -- --  11/27/20 2320 -- -- -- -- -- 97 % -- --  11/27/20 2315 -- -- -- -- -- 98 % -- --  11/27/20 2310 -- -- -- -- -- 98 % -- --  11/27/20 2305 (!) 86/52 -- -- (!) 108 18 99 % -- --  11/27/20 2300 -- -- -- -- -- 97 % -- --  11/27/20 2255 -- -- -- -- -- 98 % -- --  11/27/20 2250 -- -- -- -- -- 98 % -- --  11/27/20 2245 -- -- -- -- -- 97 % -- --  11/27/20 2240 -- -- -- -- -- 99 % -- --  11/27/20 2235 -- -- -- -- -- 98 % -- --  11/27/20 2230 -- -- -- -- -- 98 % -- --  11/27/20 2225 -- -- -- -- -- 98 % -- --  11/27/20 2220 -- -- -- -- -- 100 % -- --  11/27/20 2215 -- -- -- -- -- 99 % -- --  11/27/20 2210 -- -- -- -- -- 98 % -- --  11/27/20 2205 -- -- -- -- -- 98 % -- --  11/27/20 2200 -- -- -- -- -- 99 % -- --  11/27/20 2155 -- -- -- -- -- 98 % -- --  11/27/20 2150 -- -- -- -- -- 99 % -- --  11/27/20 2145 -- -- -- -- -- 100 % -- --  11/27/20 2140 -- -- -- -- -- 99 % -- --  11/27/20 2135 -- -- -- -- --  97 % -- --  11/27/20 2130 (!) 80/44 -- -- (!) 116 -- 98 % -- --  11/27/20 2129 (!) 87/47 -- -- (!) 114 -- -- -- --  11/27/20 2128 -- 98.3 F (36.8 C) Oral (!) 112 18 98 % -- --  11/27/20 2125 -- -- -- -- -- 99 % -- --  11/27/20 2048 105/68 98.5 F (36.9 C) Oral (!) 122 20 100 % 5' 8"  (1.727 m) 79.4 kg   Constitutional: Well-developed, well-nourished female in no moderate  distress.  Cardiovascular: normal rate Respiratory: normal effort GI: Abd soft, non-tender, gravid appropriate for gestational age.  MS: Extremities nontender, no edema, normal ROM Neurologic: Alert and oriented x 4.  GU: Neg CVAT.  PELVIC EXAM:   Dilation: 1 Effacement (%): 50 Station: -3 Exam by:: Fatima Blank, CNM  FHT:  Baseline 145 , moderate variability, accelerations present, no decelerations Contractions: q 3-8 mins, mild to palpation   Labs: Results for orders placed or performed during the hospital encounter of 11/27/20 (from the past 24 hour(s))  CBC     Status: Abnormal   Collection Time: 11/27/20  9:53 PM  Result Value Ref Range   WBC 18.5 (H) 4.0 - 10.5 K/uL   RBC 4.31 3.87 - 5.11 MIL/uL   Hemoglobin 13.0 12.0 - 15.0 g/dL   HCT 39.6 36.0 - 46.0 %   MCV 91.9 80.0 - 100.0 fL   MCH 30.2 26.0 - 34.0 pg   MCHC 32.8 30.0 - 36.0 g/dL   RDW 13.7 11.5 - 15.5 %   Platelets 479 (H) 150 - 400 K/uL   nRBC 0.0 0.0 - 0.2 %  Comprehensive metabolic panel     Status: Abnormal   Collection Time: 11/27/20  9:53 PM  Result Value Ref Range   Sodium 132 (L) 135 - 145 mmol/L   Potassium 4.5 3.5 - 5.1 mmol/L   Chloride 103 98 - 111 mmol/L   CO2 18 (L) 22 - 32 mmol/L   Glucose, Bld 76 70 - 99 mg/dL   BUN 7 6 - 20 mg/dL   Creatinine, Ser 0.69 0.44 - 1.00 mg/dL   Calcium 8.3 (L) 8.9 - 10.3 mg/dL   Total Protein 6.8 6.5 - 8.1 g/dL   Albumin 2.7 (L) 3.5 - 5.0 g/dL   AST 45 (H) 15 - 41 U/L   ALT 30 0 - 44 U/L   Alkaline Phosphatase 199 (H) 38 - 126 U/L   Total Bilirubin 1.3 (H) 0.3 - 1.2 mg/dL   GFR, Estimated >60 >60 mL/min   Anion gap 11 5 - 15  Resp Panel by RT-PCR (Flu A&B, Covid) Nasopharyngeal Swab     Status: None   Collection Time: 11/27/20 10:19 PM   Specimen: Nasopharyngeal Swab; Nasopharyngeal(NP) swabs in vial transport medium  Result Value Ref Range   SARS Coronavirus 2 by RT PCR NEGATIVE NEGATIVE   Influenza A by PCR NEGATIVE NEGATIVE   Influenza B by  PCR NEGATIVE NEGATIVE   --/--/O POS (04/08 2123)  Imaging:  No results found.  MAU Course/MDM: Orders Placed This Encounter  Procedures   Resp Panel by RT-PCR (Flu A&B, Covid) Nasopharyngeal Swab   Urinalysis, Routine w reflex microscopic Urine, Clean Catch   CBC   Comprehensive metabolic panel   Discharge patient    Meds ordered this encounter  Medications   lactated ringers bolus 1,000 mL   promethazine (PHENERGAN) 12.5 mg in sodium chloride 0.9 % 50 mL IVPB   lactated ringers bolus  1,000 mL   ondansetron (ZOFRAN) injection 4 mg   ondansetron (ZOFRAN) 4 MG tablet    Sig: Take 1 tablet (4 mg total) by mouth every 8 (eight) hours as needed for nausea or vomiting.    Dispense:  20 tablet    Refill:  0    Order Specific Question:   Supervising Provider    Answer:   Verita Schneiders A [[id number]]   promethazine (PHENERGAN) 12.5 MG tablet    Sig: Take 1-2 tablets (12.5-25 mg total) by mouth every 6 (six) hours as needed for nausea or vomiting.    Dispense:  30 tablet    Refill:  0    Order Specific Question:   Supervising Provider    Answer:   Verita Schneiders A [[id number]]     NST reviewed and reactive Pt with new onset n/v, resolved with IV fluids, antiemetics in MAU Irregular contractions and pt with planned repeat cesarean so evaluated for labor Cervix unchanged at 1/50/-3 in 2+ hours in MAU so no evidence of labor D/C home with Rx for Zofran and Phenergan Pt to rest, drink PO fluids, f/u in office this week as scheduled Return to MAU as needed for worsening symptoms or emergencies.    Assessment: 1. Gastroenteritis   2. Nausea and vomiting in pregnancy   3. Braxton Hicks contractions   4. [redacted] weeks gestation of pregnancy     Plan: Discharge home Labor precautions and fetal kick counts  Follow-up Information     [facility] Follow up.   Why: As scheduled on 12/03/20 Contact information: [address]  [address] [place]  [address] [phone]         [facility] Follow up.   Specialty: Obstetrics and Gynecology Why: Return to MAU as needed for emergencies Contact information: [address] [id number] [place] [phone]               Allergies as of 11/28/2020   No Known Allergies      Medication List     STOP taking these medications    hydrOXYzine 10 MG tablet Commonly known as: ATARAX/VISTARIL   mesalamine 1.2 g EC tablet Commonly known as: LIALDA   predniSONE 10 MG tablet Commonly known as: DELTASONE   STELARA Garrison   traZODone 100 MG tablet Commonly known as: DESYREL   venlafaxine XR 75 MG 24 hr capsule Commonly known as: EFFEXOR-XR       TAKE these medications    GNP PRENATAL PO Take 1 tablet by mouth daily.   ondansetron 4 MG tablet Commonly known as: Zofran Take 1 tablet (4 mg total) by mouth every 8 (eight) hours as needed for nausea or vomiting.   promethazine 12.5 MG tablet Commonly known as: PHENERGAN Take 1-2 tablets (12.5-25 mg total) by mouth every 6 (six) hours as needed for nausea or vomiting.        Fatima Blank Certified Nurse-Midwife 11/28/2020 1:01 AM

## 2020-11-27 NOTE — MAU Note (Signed)
..  Laura Vaughn is a 25 y.o. at 81w2dhere in MAU reporting: Nausea all day, she has vomited once. States the nausea is debilitating. Reports irregular contractions, that are uncomfortable but not painful. Denies vaginal bleeding or leaking of fluid. +FM.   Pain score: 1/10 Vitals:   11/27/20 2048  BP: 105/68  Pulse: (!) 122  Resp: 20  Temp: 98.5 F (36.9 C)  SpO2: 100%     FHT:155 Lab orders placed from triage: UA

## 2020-11-28 ENCOUNTER — Encounter (HOSPITAL_COMMUNITY): Payer: Self-pay | Admitting: Obstetrics and Gynecology

## 2020-11-28 ENCOUNTER — Other Ambulatory Visit: Payer: Self-pay

## 2020-11-28 ENCOUNTER — Inpatient Hospital Stay (EMERGENCY_DEPARTMENT_HOSPITAL)
Admission: AD | Admit: 2020-11-28 | Discharge: 2020-11-28 | Disposition: A | Discharge status: Home / Self Care | Payer: BC Managed Care – PPO | Source: Home / Self Care | Attending: Obstetrics and Gynecology | Admitting: Obstetrics and Gynecology

## 2020-11-28 DIAGNOSIS — Z3689 Encounter for other specified antenatal screening: Secondary | ICD-10-CM | POA: Insufficient documentation

## 2020-11-28 DIAGNOSIS — O4703 False labor before 37 completed weeks of gestation, third trimester: Secondary | ICD-10-CM

## 2020-11-28 DIAGNOSIS — Z3A36 36 weeks gestation of pregnancy: Secondary | ICD-10-CM

## 2020-11-28 DIAGNOSIS — K529 Noninfective gastroenteritis and colitis, unspecified: Secondary | ICD-10-CM | POA: Diagnosis not present

## 2020-11-28 DIAGNOSIS — O219 Vomiting of pregnancy, unspecified: Secondary | ICD-10-CM

## 2020-11-28 LAB — URINALYSIS, ROUTINE W REFLEX MICROSCOPIC
Bilirubin Urine: NEGATIVE
Bilirubin Urine: NEGATIVE
Glucose, UA: NEGATIVE mg/dL
Glucose, UA: NEGATIVE mg/dL
Hgb urine dipstick: NEGATIVE
Hgb urine dipstick: NEGATIVE
Ketones, ur: 20 mg/dL — AB
Ketones, ur: 80 mg/dL — AB
Leukocytes,Ua: NEGATIVE
Leukocytes,Ua: NEGATIVE
Nitrite: NEGATIVE
Nitrite: NEGATIVE
Protein, ur: NEGATIVE mg/dL
Protein, ur: NEGATIVE mg/dL
Specific Gravity, Urine: 1.006 (ref 1.005–1.030)
Specific Gravity, Urine: 1.023 (ref 1.005–1.030)
pH: 6 (ref 5.0–8.0)
pH: 6 (ref 5.0–8.0)

## 2020-11-28 MED ORDER — ONDANSETRON HCL 4 MG PO TABS: 4.0000 mg | ORAL_TABLET | Freq: Three times a day (TID) | ORAL | 0 refills | Status: DC | PRN

## 2020-11-28 MED ORDER — PROMETHAZINE HCL 12.5 MG PO TABS: 12.5000 mg | ORAL_TABLET | Freq: Four times a day (QID) | ORAL | 0 refills | Status: DC | PRN

## 2020-11-28 NOTE — MAU Note (Signed)
Laura Vaughn is a 25 y.o. at 58w3dhere in MAU reporting: was here overnight for dehydration which kick started some contractions. States the contractions have been getting stronger since she was discharged. They are every 3-15 minutes. No bleeding or LOF. +FM.  Onset of complaint: last night  Pain score: 6/10  Vitals:   11/28/20 1141  BP: 117/73  Pulse: (!) 105  Resp: 17  Temp: 98.1 F (36.7 C)  SpO2: 98%     FHT:144  Lab orders placed from triage: UA

## 2020-11-28 NOTE — Discharge Instructions (Signed)
The [employer]  This circuit takes at least 90 minutes to complete so clear your schedule and make mental preparations so you can relax in your environment. The second step requires a lot of pillows so gather them up before beginning Before starting, you should empty your bladder! Have a nice drink nearby, and make sure it has a straw! If you are having contractions, this circuit should be done through contractions, try not to change positions between steps Before you begin...  "I named this 'circuit' after my friend Jerold Coombe, who shared and discussed it with me when I was working with a client whose labor seemed to be stalled out and no longer progressing... This circuit is useful to help get the baby lined up, ideally, in the "Left Occiput Anterior" (LOA) Position, both before labor begins and when some corrections need to be done during labor. Prenatally, this position set can help to rotate a baby. As a natural method of induction, this can help get things going if baby just needed a gentle nudge of position to set things off. To the best of my knowledge, this group of positions will not "hurt" a baby that is already lined up correctly." - Greggory Stallion   Step One: Open-knee Chest Stay in this position for 30 minutes, start in cat/cow, then drop your chest as low as you can to the bed or the floor and your bottom as high as you can. Knees should be fairly wide apart, and the angle between the torso/thighs should be wider than 90 degrees. Wiggle around, prop with lots of pillows and use this time to get totally relaxed. This position allows the baby to scoot out of the pelvis a bit and gives them room to rotate, shift their head position, etc. If the pregnant person finds it helpful, careful positioning with a rebozo under the belly, with gentle tension from a support person behind can help maintain this position for the full 30 minutes.  Step FKC:LEXNTZGYFVC Left Side  Lying Roll to your left side, bringing your top leg as high as possible and keeping your bottom leg straight. Roll forward as much as possible, again using a lot of pillows. Sink into the bed and relax some more. If you fall asleep, that's totally okay and you can stay there! If not, stay here for at least another half an hour. Try and get your top right leg up towards your head and get as rolled over onto your belly as much as possible. If you repeat the circuit during labor, try alternating left and right sides. We know the photo the left is actually right side... just flip the image in your head.  Step Three: Moving and Lunges Lunge, walk stairs facing sideways, 2 at a time, (have a spotter downstairs of you!), take a walk outside with one foot on the curb and the other on the street, sit on a birth ball and hula- anything that's upright and putting your pelvis in open, asymmetrical positions. Spend at least 30 minutes doing this one as well to give your baby a chance to move down. If you are lunging or stair or curb walking, you should lunge/walk/go up stairs in the direction that feels better to you. The key with the lunge is that the toes of the higher leg and mom's belly button should be at right angles. Do not lunge over your knee, that closes the pelvis.     [place] Creator - [email or web address] Ivin Booty  Muza, CD, BDT (DONA), LCCE, FACCE: Supporting Content - [email or web address] Jon Gills: Photography - [email or web address] Trina Ao CD/CDT [facility]): Print and Webmaster - [email or web address]

## 2020-11-28 NOTE — MAU Note (Signed)
Patient signed printed AVS. Placed in chart.
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
## 2020-11-28 NOTE — MAU Note (Signed)
I have communicated with Gaylan Gerold, CNM, and reviewed vital signs:  Vitals:   11/28/20 1306 11/28/20 1403  BP: 107/66 127/70  Pulse: 99 (!) 103  Resp:  17  Temp:    SpO2: 98% 99%    Vaginal exam:  Dilation: 1 Effacement (%): 50 Cervical Position: Posterior Station: -3 Presentation: Vertex Exam by:: Cloretta Ned, RN,   Also reviewed contraction pattern and that non-stress test is reactive.  It has been documented that patient is contracting every 12 minutes with no cervical change over 1 hour not indicating active labor.  Patient denies any other complaints.  Based on this report provider has given order for discharge.  A discharge order and diagnosis entered by a provider.   Labor discharge instructions reviewed with patient.

## 2020-12-03 ENCOUNTER — Other Ambulatory Visit: Payer: Self-pay

## 2020-12-14 ENCOUNTER — Encounter (HOSPITAL_COMMUNITY): Payer: Self-pay | Admitting: Obstetrics and Gynecology

## 2020-12-14 ENCOUNTER — Other Ambulatory Visit: Payer: Self-pay

## 2020-12-14 ENCOUNTER — Encounter (HOSPITAL_COMMUNITY)
Admission: RE | Admit: 2020-12-14 | Discharge: 2020-12-14 | Disposition: A | Discharge status: Home / Self Care | Payer: BC Managed Care – PPO | Source: Ambulatory Visit | Attending: Obstetrics and Gynecology | Admitting: Obstetrics and Gynecology

## 2020-12-14 DIAGNOSIS — K5190 Ulcerative colitis, unspecified, without complications: Secondary | ICD-10-CM | POA: Diagnosis not present

## 2020-12-14 DIAGNOSIS — Z3A39 39 weeks gestation of pregnancy: Secondary | ICD-10-CM | POA: Diagnosis not present

## 2020-12-14 DIAGNOSIS — O9962 Diseases of the digestive system complicating childbirth: Secondary | ICD-10-CM | POA: Diagnosis not present

## 2020-12-14 DIAGNOSIS — Z01812 Encounter for preprocedural laboratory examination: Secondary | ICD-10-CM | POA: Insufficient documentation

## 2020-12-14 DIAGNOSIS — Z98891 History of uterine scar from previous surgery: Secondary | ICD-10-CM

## 2020-12-14 DIAGNOSIS — D649 Anemia, unspecified: Secondary | ICD-10-CM | POA: Diagnosis not present

## 2020-12-14 DIAGNOSIS — Z20822 Contact with and (suspected) exposure to covid-19: Secondary | ICD-10-CM | POA: Diagnosis not present

## 2020-12-14 DIAGNOSIS — O34211 Maternal care for low transverse scar from previous cesarean delivery: Secondary | ICD-10-CM | POA: Diagnosis not present

## 2020-12-14 DIAGNOSIS — O9902 Anemia complicating childbirth: Secondary | ICD-10-CM | POA: Diagnosis not present

## 2020-12-14 LAB — CBC
HCT: 38.1 % (ref 36.0–46.0)
Hemoglobin: 12.8 g/dL (ref 12.0–15.0)
MCH: 30.6 pg (ref 26.0–34.0)
MCHC: 33.6 g/dL (ref 30.0–36.0)
MCV: 91.1 fL (ref 80.0–100.0)
Platelets: 435 10*3/uL — ABNORMAL HIGH (ref 150–400)
RBC: 4.18 MIL/uL (ref 3.87–5.11)
RDW: 14.6 % (ref 11.5–15.5)
WBC: 13.3 10*3/uL — ABNORMAL HIGH (ref 4.0–10.5)
nRBC: 0 % (ref 0.0–0.2)

## 2020-12-14 LAB — RESP PANEL BY RT-PCR (FLU A&B, COVID) ARPGX2
Influenza A by PCR: NEGATIVE
Influenza B by PCR: NEGATIVE
SARS Coronavirus 2 by RT PCR: NEGATIVE

## 2020-12-14 NOTE — Patient Instructions (Signed)
Ammi Hutt  12/14/2020   Your procedure is scheduled on:  12/16/20  Arrive at [address] at [facility] on [address] at [facility] and [employer] [address]. You are invited to use the FREE valet parking or use the  Visitor's parking deck.  Pick up the phone at the desk and dial [phone].  Call this number if you have problems the morning of surgery: [phone]   Remember:   Do not eat food:(After Midnight) Desps de medianoche.  Do not drink clear liquids: (After Midnight) Desps de medianoche.  Take these medicines the morning of surgery with A SIP OF WATER:  Take meds as needed   Do not wear jewelry, make-up or nail polish.  Do not wear lotions, powders, or perfumes. Do not wear deodorant.  Do not shave 48 hours prior to surgery.  Do not bring valuables to the hospital.  [facility] is not   responsible for any belongings or valuables brought to the hospital.  Contacts, dentures or bridgework may not be worn into surgery.  Leave suitcase in the car. After surgery it may be brought to your room.  For patients admitted to the hospital, checkout time is 11:00 AM the day of              discharge.      Please read over the following fact sheets that you were given: Preparing for Surgery

## 2020-12-15 LAB — RPR: RPR Ser Ql: NONREACTIVE

## 2020-12-15 NOTE — Anesthesia Preprocedure Evaluation (Addendum)
Anesthesia Evaluation  Patient identified by MRN, date of birth, ID band Patient awake    Reviewed: Allergy & Precautions, NPO status , Patient's Chart, lab work & pertinent test results  Airway Mallampati: I  TM Distance: >3 FB Neck ROM: Full    Dental no notable dental hx. (+) Teeth Intact, Dental Advisory Given   Pulmonary neg pulmonary ROS,    Pulmonary exam normal breath sounds clear to auscultation       Cardiovascular Exercise Tolerance: Good Normal cardiovascular exam Rhythm:Regular Rate:Normal     Neuro/Psych  Headaches,    GI/Hepatic PUD,   Endo/Other  negative endocrine ROS  Renal/GU Lab Results      Component                Value               Date                      CREATININE               0.69                11/27/2020                BUN                      7                   11/27/2020                NA                       132 (L)             11/27/2020                K                        4.5                 11/27/2020                CL                       103                 11/27/2020                CO2                      18 (L)              11/27/2020                Musculoskeletal negative musculoskeletal ROS (+)   Abdominal   Peds  Hematology Lab Results      Component                Value               Date                      WBC                      13.3 (H)  12/14/2020                HGB                      12.8                12/14/2020                HCT                      38.1                12/14/2020                MCV                      91.1                12/14/2020                PLT                      435 (H)             12/14/2020            T& S available   Anesthesia Other Findings   Reproductive/Obstetrics (+) Pregnancy                           Anesthesia Physical Anesthesia Plan  ASA: 2  Anesthesia Plan: Spinal    Post-op Pain Management: Regional block and Dilaudid IV   Induction:   PONV Risk Score and Plan: 3 and Treatment may vary due to age or medical condition and Ondansetron  Airway Management Planned: Nasal Cannula and Natural Airway  Additional Equipment: None  Intra-op Plan:   Post-operative Plan:   Informed Consent: I have reviewed the patients History and Physical, chart, labs and discussed the procedure including the risks, benefits and alternatives for the proposed anesthesia with the patient or authorized representative who has indicated his/her understanding and acceptance.     Dental advisory given  Plan Discussed with: CRNA and Anesthesiologist  Anesthesia Plan Comments: (39 wk G3P2 for Repeat C/S)       Anesthesia Quick Evaluation

## 2020-12-16 ENCOUNTER — Inpatient Hospital Stay (HOSPITAL_COMMUNITY): Payer: BC Managed Care – PPO | Admitting: Anesthesiology

## 2020-12-16 ENCOUNTER — Encounter (HOSPITAL_COMMUNITY)
Admission: RE | Disposition: A | Discharge status: Home / Self Care | Payer: Self-pay | Source: Ambulatory Visit | Attending: Obstetrics and Gynecology

## 2020-12-16 ENCOUNTER — Encounter (HOSPITAL_COMMUNITY): Payer: Self-pay | Admitting: Obstetrics and Gynecology

## 2020-12-16 ENCOUNTER — Other Ambulatory Visit: Payer: Self-pay

## 2020-12-16 ENCOUNTER — Inpatient Hospital Stay (HOSPITAL_COMMUNITY)
Admission: RE | Admit: 2020-12-16 | Discharge: 2020-12-18 | DRG: 787 | Disposition: A | Discharge status: Home / Self Care | Payer: BC Managed Care – PPO | Source: Ambulatory Visit | Attending: Obstetrics and Gynecology | Admitting: Obstetrics and Gynecology

## 2020-12-16 DIAGNOSIS — O9962 Diseases of the digestive system complicating childbirth: Secondary | ICD-10-CM | POA: Diagnosis present

## 2020-12-16 DIAGNOSIS — K5190 Ulcerative colitis, unspecified, without complications: Secondary | ICD-10-CM | POA: Diagnosis present

## 2020-12-16 DIAGNOSIS — Z20822 Contact with and (suspected) exposure to covid-19: Secondary | ICD-10-CM | POA: Diagnosis present

## 2020-12-16 DIAGNOSIS — O34211 Maternal care for low transverse scar from previous cesarean delivery: Secondary | ICD-10-CM | POA: Diagnosis present

## 2020-12-16 DIAGNOSIS — Z3A39 39 weeks gestation of pregnancy: Secondary | ICD-10-CM | POA: Diagnosis not present

## 2020-12-16 DIAGNOSIS — Z98891 History of uterine scar from previous surgery: Secondary | ICD-10-CM

## 2020-12-16 HISTORY — DX: Scoliosis, unspecified: M41.9

## 2020-12-16 LAB — TYPE AND SCREEN
ABO/RH(D): O POS
ABO/RH(D): O POS
Antibody Screen: NEGATIVE
Antibody Screen: NEGATIVE

## 2020-12-16 SURGERY — Surgical Case
Anesthesia: Spinal | Wound class: Clean Contaminated

## 2020-12-16 MED ORDER — OXYTOCIN-SODIUM CHLORIDE 30-0.9 UT/500ML-% IV SOLN
INTRAVENOUS | Status: DC | PRN
  Administered 2020-12-16: 300 mL via INTRAVENOUS

## 2020-12-16 MED ORDER — STERILE WATER FOR IRRIGATION IR SOLN
Status: DC | PRN
  Administered 2020-12-16: 1000 mL

## 2020-12-16 MED ORDER — FENTANYL CITRATE (PF) 100 MCG/2ML IJ SOLN
INTRAMUSCULAR | Status: DC | PRN
  Administered 2020-12-16: 15 ug via INTRATHECAL

## 2020-12-16 MED ORDER — DEXAMETHASONE SODIUM PHOSPHATE 4 MG/ML IJ SOLN
INTRAMUSCULAR | Status: DC | PRN
  Administered 2020-12-16: 4 mg via INTRAVENOUS

## 2020-12-16 MED ORDER — NALBUPHINE HCL 10 MG/ML IJ SOLN: 5.0000 mg | INTRAMUSCULAR | Status: DC | PRN

## 2020-12-16 MED ORDER — BUPIVACAINE IN DEXTROSE 0.75-8.25 % IT SOLN
INTRATHECAL | Status: DC | PRN
  Administered 2020-12-16: 12 mg via INTRATHECAL

## 2020-12-16 MED ORDER — IBUPROFEN 600 MG PO TABS
600.0000 mg | ORAL_TABLET | Freq: Four times a day (QID) | ORAL | Status: DC
  Administered 2020-12-16 – 2020-12-18 (×7): 600 mg via ORAL
  Filled 2020-12-16 (×7): qty 1

## 2020-12-16 MED ORDER — ONDANSETRON HCL 4 MG/2ML IJ SOLN
INTRAMUSCULAR | Status: DC | PRN
  Administered 2020-12-16: 4 mg via INTRAVENOUS

## 2020-12-16 MED ORDER — OXYCODONE HCL 5 MG/5ML PO SOLN: 5.0000 mg | Freq: Once | ORAL | Status: DC | PRN

## 2020-12-16 MED ORDER — OXYCODONE HCL 5 MG PO TABS
5.0000 mg | ORAL_TABLET | ORAL | Status: DC | PRN
  Administered 2020-12-17 – 2020-12-18 (×2): 5 mg via ORAL
  Filled 2020-12-16 (×2): qty 1

## 2020-12-16 MED ORDER — ZOLPIDEM TARTRATE 5 MG PO TABS: 5.0000 mg | ORAL_TABLET | Freq: Every evening | ORAL | Status: DC | PRN

## 2020-12-16 MED ORDER — SOD CITRATE-CITRIC ACID 500-334 MG/5ML PO SOLN
ORAL | Status: AC
  Filled 2020-12-16: qty 30

## 2020-12-16 MED ORDER — SODIUM CHLORIDE 0.9% FLUSH: 3.0000 mL | INTRAVENOUS | Status: DC | PRN

## 2020-12-16 MED ORDER — SCOPOLAMINE 1 MG/3DAYS TD PT72: 1.0000 | MEDICATED_PATCH | Freq: Once | TRANSDERMAL | Status: DC

## 2020-12-16 MED ORDER — MORPHINE SULFATE (PF) 0.5 MG/ML IJ SOLN
INTRAMUSCULAR | Status: DC | PRN
  Administered 2020-12-16: 150 ug via INTRATHECAL

## 2020-12-16 MED ORDER — NALOXONE HCL 0.4 MG/ML IJ SOLN: 0.4000 mg | INTRAMUSCULAR | Status: DC | PRN

## 2020-12-16 MED ORDER — SOD CITRATE-CITRIC ACID 500-334 MG/5ML PO SOLN
30.0000 mL | ORAL | Status: AC
  Administered 2020-12-16: 30 mL via ORAL

## 2020-12-16 MED ORDER — KETOROLAC TROMETHAMINE 30 MG/ML IJ SOLN
INTRAMUSCULAR | Status: AC
  Filled 2020-12-16: qty 1

## 2020-12-16 MED ORDER — CEFAZOLIN SODIUM-DEXTROSE 2-4 GM/100ML-% IV SOLN
2.0000 g | INTRAVENOUS | Status: AC
  Administered 2020-12-16: 2 g via INTRAVENOUS

## 2020-12-16 MED ORDER — HYDROMORPHONE HCL 1 MG/ML IJ SOLN: 0.2500 mg | INTRAMUSCULAR | Status: DC | PRN

## 2020-12-16 MED ORDER — SIMETHICONE 80 MG PO CHEW
80.0000 mg | CHEWABLE_TABLET | Freq: Three times a day (TID) | ORAL | Status: DC
  Administered 2020-12-16 – 2020-12-18 (×5): 80 mg via ORAL
  Filled 2020-12-16 (×4): qty 1

## 2020-12-16 MED ORDER — KETOROLAC TROMETHAMINE 30 MG/ML IJ SOLN: 30.0000 mg | Freq: Once | INTRAMUSCULAR | Status: DC

## 2020-12-16 MED ORDER — COCONUT OIL OIL: 1.0000 "application " | TOPICAL_OIL | Status: DC | PRN

## 2020-12-16 MED ORDER — DIPHENHYDRAMINE HCL 50 MG/ML IJ SOLN: 12.5000 mg | INTRAMUSCULAR | Status: DC | PRN

## 2020-12-16 MED ORDER — SODIUM CHLORIDE 0.9 % IR SOLN
Status: DC | PRN
  Administered 2020-12-16: 1000 mL

## 2020-12-16 MED ORDER — OXYTOCIN-SODIUM CHLORIDE 30-0.9 UT/500ML-% IV SOLN: INTRAVENOUS | Status: DC | PRN

## 2020-12-16 MED ORDER — NALBUPHINE HCL 10 MG/ML IJ SOLN: 5.0000 mg | Freq: Once | INTRAMUSCULAR | Status: DC | PRN

## 2020-12-16 MED ORDER — OXYTOCIN-SODIUM CHLORIDE 30-0.9 UT/500ML-% IV SOLN
INTRAVENOUS | Status: AC
  Filled 2020-12-16: qty 500

## 2020-12-16 MED ORDER — SIMETHICONE 80 MG PO CHEW: 80.0000 mg | CHEWABLE_TABLET | ORAL | Status: DC | PRN

## 2020-12-16 MED ORDER — KETOROLAC TROMETHAMINE 30 MG/ML IJ SOLN
30.0000 mg | Freq: Once | INTRAMUSCULAR | Status: AC | PRN
  Administered 2020-12-16: 30 mg via INTRAVENOUS

## 2020-12-16 MED ORDER — OXYCODONE HCL 5 MG PO TABS: 5.0000 mg | ORAL_TABLET | Freq: Once | ORAL | Status: DC | PRN

## 2020-12-16 MED ORDER — PHENYLEPHRINE HCL (PRESSORS) 10 MG/ML IV SOLN
INTRAVENOUS | Status: DC | PRN
  Administered 2020-12-16: 80 ug via INTRAVENOUS
  Administered 2020-12-16: 40 ug via INTRAVENOUS
  Administered 2020-12-16 (×2): 80 ug via INTRAVENOUS

## 2020-12-16 MED ORDER — DIPHENHYDRAMINE HCL 25 MG PO CAPS: 25.0000 mg | ORAL_CAPSULE | ORAL | Status: DC | PRN

## 2020-12-16 MED ORDER — ONDANSETRON HCL 4 MG/2ML IJ SOLN: 4.0000 mg | Freq: Once | INTRAMUSCULAR | Status: DC | PRN

## 2020-12-16 MED ORDER — ONDANSETRON HCL 4 MG/2ML IJ SOLN
4.0000 mg | Freq: Once | INTRAMUSCULAR | Status: AC
  Administered 2020-12-16: 4 mg via INTRAVENOUS

## 2020-12-16 MED ORDER — TETANUS-DIPHTH-ACELL PERTUSSIS 5-2.5-18.5 LF-MCG/0.5 IM SUSY: 0.5000 mL | PREFILLED_SYRINGE | Freq: Once | INTRAMUSCULAR | Status: DC

## 2020-12-16 MED ORDER — ONDANSETRON HCL 4 MG/2ML IJ SOLN
INTRAMUSCULAR | Status: AC
  Filled 2020-12-16: qty 2

## 2020-12-16 MED ORDER — MORPHINE SULFATE (PF) 0.5 MG/ML IJ SOLN
INTRAMUSCULAR | Status: AC
  Filled 2020-12-16: qty 10

## 2020-12-16 MED ORDER — NALOXONE HCL 4 MG/10ML IJ SOLN
1.0000 ug/kg/h | INTRAVENOUS | Status: DC | PRN
  Filled 2020-12-16: qty 5

## 2020-12-16 MED ORDER — PHENYLEPHRINE HCL-NACL 20-0.9 MG/250ML-% IV SOLN
INTRAVENOUS | Status: AC
  Filled 2020-12-16: qty 250

## 2020-12-16 MED ORDER — ACETAMINOPHEN 500 MG PO TABS
1000.0000 mg | ORAL_TABLET | ORAL | Status: AC
  Administered 2020-12-16: 1000 mg via ORAL

## 2020-12-16 MED ORDER — CEFAZOLIN SODIUM-DEXTROSE 2-4 GM/100ML-% IV SOLN
INTRAVENOUS | Status: AC
  Filled 2020-12-16: qty 100

## 2020-12-16 MED ORDER — WITCH HAZEL-GLYCERIN EX PADS: 1.0000 "application " | MEDICATED_PAD | CUTANEOUS | Status: DC | PRN

## 2020-12-16 MED ORDER — FENTANYL CITRATE (PF) 100 MCG/2ML IJ SOLN
INTRAMUSCULAR | Status: AC
  Filled 2020-12-16: qty 2

## 2020-12-16 MED ORDER — DEXAMETHASONE SODIUM PHOSPHATE 4 MG/ML IJ SOLN
INTRAMUSCULAR | Status: AC
  Filled 2020-12-16: qty 1

## 2020-12-16 MED ORDER — MENTHOL 3 MG MT LOZG: 1.0000 | LOZENGE | OROMUCOSAL | Status: DC | PRN

## 2020-12-16 MED ORDER — DIBUCAINE (PERIANAL) 1 % EX OINT: 1.0000 "application " | TOPICAL_OINTMENT | CUTANEOUS | Status: DC | PRN

## 2020-12-16 MED ORDER — POVIDONE-IODINE 10 % EX SWAB
2.0000 "application " | Freq: Once | CUTANEOUS | Status: AC
  Administered 2020-12-16: 2 via TOPICAL

## 2020-12-16 MED ORDER — ACETAMINOPHEN 500 MG PO TABS
ORAL_TABLET | ORAL | Status: AC
  Filled 2020-12-16: qty 2

## 2020-12-16 MED ORDER — OXYTOCIN-SODIUM CHLORIDE 30-0.9 UT/500ML-% IV SOLN
2.5000 [IU]/h | INTRAVENOUS | Status: AC
  Administered 2020-12-16: 2.5 [IU]/h via INTRAVENOUS

## 2020-12-16 MED ORDER — PRENATAL MULTIVITAMIN CH
1.0000 | ORAL_TABLET | Freq: Every day | ORAL | Status: DC
  Administered 2020-12-17: 1 via ORAL
  Filled 2020-12-16: qty 1

## 2020-12-16 MED ORDER — ACETAMINOPHEN 500 MG PO TABS
1000.0000 mg | ORAL_TABLET | Freq: Four times a day (QID) | ORAL | Status: DC
  Administered 2020-12-16 – 2020-12-18 (×7): 1000 mg via ORAL
  Filled 2020-12-16 (×7): qty 2

## 2020-12-16 MED ORDER — DIPHENHYDRAMINE HCL 25 MG PO CAPS: 25.0000 mg | ORAL_CAPSULE | Freq: Four times a day (QID) | ORAL | Status: DC | PRN

## 2020-12-16 MED ORDER — ONDANSETRON HCL 4 MG/2ML IJ SOLN: 4.0000 mg | Freq: Three times a day (TID) | INTRAMUSCULAR | Status: DC | PRN

## 2020-12-16 MED ORDER — SENNOSIDES-DOCUSATE SODIUM 8.6-50 MG PO TABS
2.0000 | ORAL_TABLET | Freq: Every day | ORAL | Status: DC
  Administered 2020-12-17 – 2020-12-18 (×2): 2 via ORAL
  Filled 2020-12-16 (×2): qty 2

## 2020-12-16 MED ORDER — PHENYLEPHRINE HCL-NACL 20-0.9 MG/250ML-% IV SOLN
INTRAVENOUS | Status: DC | PRN
Start: 2020-12-16 — End: 2020-12-16
  Administered 2020-12-16: 60 ug/min via INTRAVENOUS

## 2020-12-16 MED ORDER — LACTATED RINGERS IV SOLN: INTRAVENOUS | Status: DC

## 2020-12-16 SURGICAL SUPPLY — 36 items
BENZOIN TINCTURE PRP APPL 2/3 (GAUZE/BANDAGES/DRESSINGS) ×2 IMPLANT
CHLORAPREP W/TINT 26ML (MISCELLANEOUS) ×2 IMPLANT
CLAMP CORD UMBIL (MISCELLANEOUS) IMPLANT
CLOSURE STERI STRIP 1/2 X4 (GAUZE/BANDAGES/DRESSINGS) ×2 IMPLANT
CLOTH BEACON ORANGE TIMEOUT ST (SAFETY) ×2 IMPLANT
DRAPE C SECTION CLR SCREEN (DRAPES) ×2 IMPLANT
DRSG OPSITE POSTOP 4X10 (GAUZE/BANDAGES/DRESSINGS) ×2 IMPLANT
ELECT REM PT RETURN 9FT ADLT (ELECTROSURGICAL) ×2
ELECTRODE REM PT RTRN 9FT ADLT (ELECTROSURGICAL) ×1 IMPLANT
EXTRACTOR VACUUM KIWI (MISCELLANEOUS) IMPLANT
GLOVE BIO SURGEON STRL SZ 6.5 (GLOVE) ×2 IMPLANT
GLOVE BIOGEL PI IND STRL 7.0 (GLOVE) ×2 IMPLANT
GLOVE BIOGEL PI INDICATOR 7.0 (GLOVE) ×2
GOWN STRL REUS W/TWL LRG LVL3 (GOWN DISPOSABLE) ×4 IMPLANT
KIT ABG SYR 3ML LUER SLIP (SYRINGE) IMPLANT
NEEDLE HYPO 25X5/8 SAFETYGLIDE (NEEDLE) IMPLANT
NS IRRIG 1000ML POUR BTL (IV SOLUTION) ×2 IMPLANT
PACK C SECTION WH (CUSTOM PROCEDURE TRAY) ×2 IMPLANT
PAD OB MATERNITY 4.3X12.25 (PERSONAL CARE ITEMS) ×2 IMPLANT
RETRACTOR WND ALEXIS 25 LRG (MISCELLANEOUS) ×1 IMPLANT
RTRCTR C-SECT PINK 25CM LRG (MISCELLANEOUS) IMPLANT
RTRCTR WOUND ALEXIS 25CM LRG (MISCELLANEOUS) ×2
STRIP CLOSURE SKIN 1/2X4 (GAUZE/BANDAGES/DRESSINGS) IMPLANT
SUT CHROMIC 1 CTX 36 (SUTURE) ×4 IMPLANT
SUT PLAIN 0 NONE (SUTURE) IMPLANT
SUT PLAIN 2 0 XLH (SUTURE) ×2 IMPLANT
SUT VIC AB 0 CT1 27 (SUTURE) ×2
SUT VIC AB 0 CT1 27XBRD ANBCTR (SUTURE) ×2 IMPLANT
SUT VIC AB 2-0 CT1 27 (SUTURE) ×1
SUT VIC AB 2-0 CT1 TAPERPNT 27 (SUTURE) ×1 IMPLANT
SUT VIC AB 3-0 CT1 27 (SUTURE)
SUT VIC AB 3-0 CT1 TAPERPNT 27 (SUTURE) IMPLANT
SUT VIC AB 4-0 KS 27 (SUTURE) ×2 IMPLANT
TOWEL OR 17X24 6PK STRL BLUE (TOWEL DISPOSABLE) ×2 IMPLANT
TRAY FOLEY W/BAG SLVR 14FR LF (SET/KITS/TRAYS/PACK) ×2 IMPLANT
WATER STERILE IRR 1000ML POUR (IV SOLUTION) ×2 IMPLANT

## 2020-12-16 NOTE — Anesthesia Procedure Notes (Signed)
Spinal  Patient location during procedure: OB Start time: 12/16/2020 10:05 AM End time: 12/16/2020 10:12 AM Reason for block: surgical anesthesia Staffing Performed: anesthesiologist  Anesthesiologist: Barnet Glasgow, MD Preanesthetic Checklist Completed: patient identified, IV checked, risks and benefits discussed, surgical consent, monitors and equipment checked, pre-op evaluation and timeout performed Spinal Block Patient position: sitting Prep: DuraPrep and site prepped and draped Patient monitoring: heart rate, cardiac monitor, continuous pulse ox and blood pressure Approach: midline Location: L3-4 Injection technique: single-shot Needle Needle type: Pencan  Needle gauge: 24 G Needle length: 10 cm Needle insertion depth: 6 cm Assessment Sensory level: T4 Events: CSF return Additional Notes  2Attempt (s). Pt tolerated procedure well.

## 2020-12-16 NOTE — H&P (Addendum)
Laura Vaughn is a 25 y.o. female [phone]  presenting for repeat cesarean section at 75 0/7wks. Pt had initially requested bilateral tubal ligation but changed her mind today. Pt had an svd with her first child and required extensive perineal repair; has ulcerative colitis and has been on lliada this pregnancy. She had a c/s with her second child.  She is dated per 6 week [place].  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 and graft from thigh after first delivery        Past Medical History:  Diagnosis Date   ADHD (attention deficit hyperactivity disorder)    Anemia    History of necrotizing fasciitis    vulvar and vaginal after last delivery   Scoliosis    Ulcerative colitis [facility])    Past Surgical History:  Procedure Laterality Date   ABDOMINAL DEBRIDEMENT  2018   x7   CESAREAN SECTION N/A 06/28/2019   Procedure: CESAREAN SECTION;  Surgeon: Laura Fowler, MD;  Location: [facility] LD ORS;  Service: Obstetrics;  Laterality: N/A;  request RNFA   OSTOMY  2018   ostomy reversal  2020   VAGINA SURGERY     Family History: family history includes ADD / ADHD in her sister and sister; Bipolar disorder in her paternal grandmother; Diabetes in her father. Social History:  reports that she has never smoked. She has never used smokeless tobacco. She reports that she does not drink alcohol and does not use drugs.     Maternal Diabetes: No Genetic Screening: Declined Maternal Ultrasounds/Referrals: Normal Fetal Ultrasounds or other Referrals:  None Maternal Substance Abuse:  No Significant Maternal Medications:  None Significant Maternal Lab Results:  Group B Strep negative Other Comments:  None  Review of Systems  Constitutional:  Negative for activity change and fatigue.  Eyes:   Negative for visual disturbance.  Respiratory:  Negative for chest tightness and shortness of breath.   Cardiovascular:  Positive for leg swelling. Negative for chest pain and palpitations.  Gastrointestinal:  Negative for abdominal pain.  Genitourinary:  Negative for vaginal pain.  Neurological:  Negative for headaches.  Psychiatric/Behavioral:  The patient is not nervous/anxious.   Maternal Medical History:  Reason for admission: Scheduled repeat cesarean section with bilateral tubal ligation   Contractions: Perceived severity is mild.   Prenatal complications: no prenatal complications Prenatal Complications - Diabetes: none.    Blood pressure 106/72, pulse 92, temperature 97.7 F (36.5 C), temperature source Oral, resp. rate 18, height 5' 8"  (1.727 m), weight 79.4 kg, last menstrual period 02/28/2020, unknown if currently breastfeeding. Maternal Exam:  Uterine Assessment: Contraction strength is mild.  Contraction frequency is rare.  Abdomen: Patient reports generalized tenderness.  Estimated fetal weight is AGA.   Fetal presentation: vertex Introitus: Normal vulva. Vulva is negative for condylomata and lesion.  Normal vagina.  Vagina is negative for condylomata.    Fetal Exam Fetal Monitor Review: Baseline rate: 140.  Variability: minimal (<5 bpm).   Pattern: accelerations present.   Fetal State Assessment: Category I - tracings are normal.  Physical Exam Constitutional:      Appearance: Normal appearance. She is normal weight.  Cardiovascular:     Rate and Rhythm: Normal rate.  Pulmonary:     Effort: Pulmonary effort is normal.  Abdominal:     General: Abdomen is flat.     Tenderness: There is generalized abdominal tenderness.  Genitourinary:    General: Normal vulva.  Vulva is no lesion.  Musculoskeletal:        General: Normal range of motion.     Cervical back: Normal range of motion.  Skin:    General: Skin is warm.     Capillary Refill: Capillary refill  takes 2 to 3 seconds.  Neurological:     General: No focal deficit present.     Mental Status: She is alert and oriented to person, place, and time. Mental status is at baseline.  Psychiatric:        Mood and Affect: Mood normal.        Behavior: Behavior normal.        Thought Content: Thought content normal.        Judgment: Judgment normal.    Prenatal labs: ABO, Rh: --/--/O POS (11/28 1005) Antibody: NEG (11/28 1005) Rubella: Immune (05/05 0000) RPR: NON REACTIVE (11/28 1030)  HBsAg: Negative (05/05 0000)  HIV: Non-reactive (05/05 0000)  GBS: Negative/-- (11/09 0000)   Assessment/Plan: 38GT [id number] female here for repeat cesarean section with bilateral tubal ligation.  - Admit  - ERAS protocol - Consent for procedure obtained _ To OR when ready    Laura Vaughn W Laura Vaughn 12/16/2020, 8:24 AM

## 2020-12-16 NOTE — Anesthesia Postprocedure Evaluation (Signed)
Anesthesia Post Note  Patient: Laura Vaughn  Procedure(s) Performed: [place]     Patient location during evaluation: Mother Baby Anesthesia Type: Spinal Level of consciousness: oriented and awake and alert Pain management: pain level controlled Vital Signs Assessment: post-procedure vital signs reviewed and stable Respiratory status: spontaneous breathing and respiratory function stable Cardiovascular status: blood pressure returned to baseline and stable Postop Assessment: no headache, no backache, no apparent nausea or vomiting and able to ambulate Anesthetic complications: no   No notable events documented.  Last Vitals:  Vitals:   12/16/20 1200 12/16/20 1215  BP: (!) 87/65 (!) 86/69  Pulse: 65 73  Resp: 15 15  Temp:    SpO2: 97% 96%    Last Pain:  Vitals:   12/16/20 1200  TempSrc:   PainSc: 0-No pain   Pain Goal:    LLE Motor Response: Purposeful movement (wiggles toes) (12/16/20 1200)   RLE Motor Response: Purposeful movement (wiggles toes) (12/16/20 1200)       Epidural/Spinal Function Cutaneous sensation: Tingles (12/16/20 1200), Patient able to flex knees: No (12/16/20 1200), Patient able to lift hips off bed: No (12/16/20 1200), Back pain beyond tenderness at insertion site: No (12/16/20 1200), Progressively worsening motor and/or sensory loss: No (12/16/20 1200), Bowel and/or bladder incontinence post epidural: No (12/16/20 1145)  [place]et [place]

## 2020-12-16 NOTE — Op Note (Addendum)
Operative Note    Preoperative Diagnosis IUP at 52 0/7wks Previous cesarean section  Hx perineal trauma after svd ; elective repeat  Ulcerative colitis - on Lliada    Postoperative Diagnosis: Same   Procedure: Repeat low transverse cesarean section with double layered closure    Surgeon: Mickle Mallory DO   Anesthesia: Epidural   Fluids:LR 2L EBL: 165m UOP: 1042m  Findings: Viable female infant in vertex position with nuchal x 1 , loose.  APGARS 9,9, Wt - pending Grossly normal uterus with thin lower uterine segment, normal tubes and ovaries    Specimen: Placenta - donated    Procedure Note  Consent verified pre-op. All questions answered  Pt opting for repeat cesarean section at 3911/7wks due to having had extensive perneeal trauma following SVD with first child. Had repeat cesarean section with second.  Pt decided against permanent sterilization today.    Patient was taken to the operating room where spinal anesthesia was administered. She was prepped and draped in the normal sterile fashion and placed in the dorsal supine position with a leftward tilt. An appropriate time out was performed.  A Pfannenstiel skin incision was then made through the previous incision with the scalpel and carried through to the underlying layer of fascia by sharp dissection and Bovie cautery. The fascia was nicked in the midline and the incision was extended laterally with Mayo scissors. The superior and inferior aspects of the incision were grasped kocher clamps and dissected off the underlying rectus muscles.Rectus muscles were separated in the midline  and the peritoneal cavity entered bluntly. The peritoneal incision was then extended both superiorly and inferiorly with careful attention to avoid both bowel and bladder. The Alexis self-retaining wound retractor was then placed and the lower uterine segment exposed. The bladder flap was developed with Metzenbaum scissors and pushed away from the  lower uterine segment. The lower uterine segment was then incised in a transverse fashion and the cavity entered after one light swipe with the scalpel.  The infant's head was then lifted and delivered from the incision without difficulty. Vigorous spontaneous cry was noted during delivery.  Nose and mouth were suctioned and a loose nuchal reduced over infants head.  The remainder of the infant delivered and the nose and mouth bulb suctioned again. The cord was clamped and cut after a minute delay. The infant was handed off to the waiting NICU team. Cord blood was obtained. The placenta was then spontaneously expressed from the uterus and the uterus cleared of all clots and debris with moist lap sponge. The uterine incision was then repaired in 2 layers the first layer was a running layer 1-0 chromic and the second an imbricating layer of the same suture.  The tubes and ovaries were inspected and the gutters cleared of all clots and debris. The uterine incision was inspected and found to be hemostatic. All instruments and sponges were then removed from the abdomen. The peritoneum and rectus muscles were then reapproximated in a running fashion with sutures of 2-0 Vicryl. The fascia was then closed with 0 Vicryl in a running fashion. Subcutaneous tissue was undermined for more cosmetic closure of skin.    The skin was closed with a subcuticular stitch of 4-0 Vicryl on a Keith needle and then reinforced with benzoin and Steri-Strips.  At the conclusion of the procedure all instruments and sponge counts were correct. Patient was taken to the recovery room in good condition with her baby accompanying her skin to  skin.    They are unsure if desire circumcision at this time

## 2020-12-16 NOTE — Transfer of Care (Signed)
Immediate Anesthesia Transfer of Care Note  Patient: Laura Vaughn  Procedure(s) Performed: CESAREAN SECTION  Patient Location: PACU  Anesthesia Type:Spinal  Level of Consciousness: awake, alert  and oriented  Airway & Oxygen Therapy: Patient Spontanous Breathing  Post-op Assessment: Report given to RN and Post -op Vital signs reviewed and stable  Post vital signs: Reviewed and stable HR 75, RR 12, Sao2 99% BP 99/63  Last Vitals:  Vitals Value Taken Time  BP    Temp    Pulse    Resp    SpO2      Last Pain:  Vitals:   12/16/20 0742  TempSrc: Oral         Complications: No notable events documented.

## 2020-12-17 LAB — CBC
HCT: 33.2 % — ABNORMAL LOW (ref 36.0–46.0)
Hemoglobin: 11.1 g/dL — ABNORMAL LOW (ref 12.0–15.0)
MCH: 30.4 pg (ref 26.0–34.0)
MCHC: 33.4 g/dL (ref 30.0–36.0)
MCV: 91 fL (ref 80.0–100.0)
Platelets: 361 10*3/uL (ref 150–400)
RBC: 3.65 MIL/uL — ABNORMAL LOW (ref 3.87–5.11)
RDW: 14.6 % (ref 11.5–15.5)
WBC: 16.3 10*3/uL — ABNORMAL HIGH (ref 4.0–10.5)
nRBC: 0 % (ref 0.0–0.2)

## 2020-12-17 NOTE — Progress Notes (Signed)
  Patient is eating, ambulating, voiding.  Pain control is good.  Vitals:   12/16/20 1659 12/16/20 2106 12/17/20 0102 12/17/20 0502  BP:  101/72 (!) 96/56 (!) 89/64  Pulse:  71 67 66  Resp: 18 18 17 16   Temp: 98.2 F (36.8 C) 97.6 F (36.4 C) 98.1 F (36.7 C) 98.1 F (36.7 C)  TempSrc: Oral Oral Oral Oral  SpO2: 99% 99% 98% 99%  Weight:      Height:        lungs:   clear to auscultation cor:    RRR Abdomen:  soft, appropriate tenderness, incisions intact and without erythema or exudate ex:    no cords   Lab Results  Component Value Date   WBC 16.3 (H) 12/17/2020   HGB 11.1 (L) 12/17/2020   HCT 33.2 (L) 12/17/2020   MCV 91.0 12/17/2020   PLT 361 12/17/2020    --/--/O POS (11/30 0830)/RI  A/P    Post operative day 1.  Routine post op and postpartum care.  Expect d/c routine.  Oxycodone for pain control.

## 2020-12-17 NOTE — Progress Notes (Signed)
Dr. Philis Pique updated on patients status and low BPs  . Patient states she is not dizzy at all and pain is controlled. Patient encouraged to use her Incentives spirometer.

## 2020-12-18 LAB — BIRTH TISSUE RECOVERY COLLECTION (PLACENTA DONATION)

## 2020-12-18 MED ORDER — OXYCODONE HCL 5 MG PO TABS: 5.0000 mg | ORAL_TABLET | ORAL | 0 refills | Status: DC | PRN

## 2020-12-18 NOTE — Discharge Summary (Signed)
Postpartum Discharge Summary  Date of Service updated 12/18/20     Patient Name: Laura Vaughn DOB: 05/08/1995 MRN: [id number]  Date of admission: 12/16/2020 Delivery date:12/16/2020  Delivering provider: Sherlyn Hay  Date of discharge: 12/18/2020  Admitting diagnosis: Previous cesarean section [Z98.891] Intrauterine pregnancy: [redacted]w[redacted]d    Secondary diagnosis:  Principal Problem:   Previous cesarean section Active Problems:   Status post repeat low transverse cesarean section  Additional problems: None    Discharge diagnosis: Term Pregnancy Delivered                                              Post partum procedures: n/a Augmentation: N/A Complications: None  Hospital course: Sceduled C/S   25y.o. yo G3P3003 at 343w0das admitted to the hospital 12/16/2020 for scheduled cesarean section with the following indication:Elective Repeat.Delivery details are as follows:  Membrane Rupture Time/Date: 10:38 AM ,12/16/2020   Delivery Method:C-Section, Low Transverse  Details of operation can be found in separate operative note.  Patient had an uncomplicated postpartum course.  She is ambulating, tolerating a regular diet, passing flatus, and urinating well. Patient is discharged home in stable condition on  12/18/20        Newborn Data: Birth date:12/16/2020  Birth time:10:40 AM  Gender:Female  Living status:Living  Apgars:8 ,9  Weight:3630 g     Physical exam  Vitals:   12/17/20 0502 12/17/20 1103 12/17/20 2100 12/18/20 0532  BP: (!) 89/64 101/63 108/66 93/63  Pulse: 66 73 66 74  Resp: 16 18 17 16   Temp: 98.1 F (36.7 C) 97.7 F (36.5 C) 98.1 F (36.7 C) 98 F (36.7 C)  TempSrc: Oral  Oral Oral  SpO2: 99% 99% 99% 98%  Weight:      Height:       General: alert, cooperative, and no distress Lochia: appropriate Uterine Fundus: firm Incision: Healing well with no significant drainage DVT Evaluation: No evidence of DVT seen on physical exam. Labs: Lab Results   Component Value Date   WBC 16.3 (H) 12/17/2020   HGB 11.1 (L) 12/17/2020   HCT 33.2 (L) 12/17/2020   MCV 91.0 12/17/2020   PLT 361 12/17/2020   CMP Latest Ref Rng & Units 11/27/2020  Glucose 70 - 99 mg/dL 76  BUN 6 - 20 mg/dL 7  Creatinine 0.44 - 1.00 mg/dL 0.69  Sodium 135 - 145 mmol/L 132(L)  Potassium 3.5 - 5.1 mmol/L 4.5  Chloride 98 - 111 mmol/L 103  CO2 22 - 32 mmol/L 18(L)  Calcium 8.9 - 10.3 mg/dL 8.3(L)  Total Protein 6.5 - 8.1 g/dL 6.8  Total Bilirubin 0.3 - 1.2 mg/dL 1.3(H)  Alkaline Phos 38 - 126 U/L 199(H)  AST 15 - 41 U/L 45(H)  ALT 0 - 44 U/L 30   Edinburgh Score: Edinburgh Postnatal Depression Scale Screening Tool 12/16/2020  I have been able to laugh and see the funny side of things. 0  I have looked forward with enjoyment to things. 0  I have blamed myself unnecessarily when things went wrong. 2  I have been anxious or worried for no good reason. 1  I have felt scared or panicky for no good reason. 0  Things have been getting on top of me. 1  I have been so unhappy that I have had difficulty sleeping. 0  I have felt  sad or miserable. 1  I have been so unhappy that I have been crying. 1  The thought of harming myself has occurred to me. 0  Edinburgh Postnatal Depression Scale Total 6      After visit meds:  Allergies as of 12/18/2020       Reactions   Almond (diagnostic)    Eggs Or Egg-derived Products         Medication List     STOP taking these medications    cholecalciferol 25 MCG (1000 UNIT) tablet Commonly known as: VITAMIN D3   ondansetron 4 MG tablet Commonly known as: Zofran   promethazine 12.5 MG tablet Commonly known as: PHENERGAN   vedolizumab 300 MG injection Commonly known as: ENTYVIO       TAKE these medications    GNP PRENATAL PO Take 1 tablet by mouth daily.   oxyCODONE 5 MG immediate release tablet Commonly known as: Oxy IR/ROXICODONE Take 1-2 tablets (5-10 mg total) by mouth every 4 (four) hours as needed  for moderate pain.   PROBIOTIC DAILY PO Take 1 capsule by mouth daily.         Discharge home in stable condition Infant Feeding: Bottle Infant Disposition:home with mother Discharge instruction: per After Visit Summary and Postpartum booklet. Activity: Advance as tolerated. Pelvic rest for 6 weeks.  Diet: routine diet Anticipated Birth Control: Unsure Postpartum Appointment:2 weeks Additional Postpartum F/U: Incision check 2 weeks Future Appointments:No future appointments. Follow up Visit:  [place], DO Follow up in 2 week(s).   Specialty: Obstetrics and Gynecology Why: 2 week incision check Contact information: [address] [place] [address] [phone]                     12/18/2020 [facility] GEFFEL Carlis Abbott, MD

## 2020-12-18 NOTE — Progress Notes (Signed)
Patient is doing well.  She is tolerating PO, ambulating, voiding.  Pain is controlled.  Lochia is appropriate  Vitals:   12/17/20 0502 12/17/20 1103 12/17/20 2100 12/18/20 0532  BP: (!) 89/64 101/63 108/66 93/63  Pulse: 66 73 66 74  Resp: 16 18 17 16   Temp: 98.1 F (36.7 C) 97.7 F (36.5 C) 98.1 F (36.7 C) 98 F (36.7 C)  TempSrc: Oral  Oral Oral  SpO2: 99% 99% 99% 98%  Weight:      Height:        NAD Abdomen:  soft, appropriate tenderness, incisions intact and without erythema or drainage ext:    Symmetric, trace edema bilaterally  Lab Results  Component Value Date   WBC 16.3 (H) 12/17/2020   HGB 11.1 (L) 12/17/2020   HCT 33.2 (L) 12/17/2020   MCV 91.0 12/17/2020   PLT 361 12/17/2020    --/--/O POS (11/30 0830)/RImmune  A/P    25 y.o. G3P3003 POD 2 s/p RCS Routine post op and postpartum care.   Meeting all goals, desires discharge to home today

## 2020-12-29 ENCOUNTER — Telehealth (HOSPITAL_COMMUNITY): Payer: Self-pay | Admitting: *Deleted

## 2020-12-29 NOTE — Telephone Encounter (Signed)
Hospital Discharge Follow-Up Call:  Patient reports that she is well and has no concerns about her healing process.  EPDS today was 3 and she endorses that she is doing well emotionally.  Patient says that baby is well and she has no concerns about baby's health.  She reports that baby sleeps in a crib.  ABCs of Safe Sleep reviewed.

## 2021-02-18 DIAGNOSIS — J029 Acute pharyngitis, unspecified: Secondary | ICD-10-CM | POA: Diagnosis not present

## 2021-02-18 DIAGNOSIS — H65192 Other acute nonsuppurative otitis media, left ear: Secondary | ICD-10-CM | POA: Diagnosis not present

## 2021-07-01 IMAGING — US US OB TRANSVAGINAL
1 series · 7 of 7 positions shown · non-contrast
Comparison: None.

CLINICAL DATA: Pregnant patient in first-trimester pregnancy with
abdominal cramping and vaginal bleeding.

EXAM:
OBSTETRIC <14 WK US AND TRANSVAGINAL OB US
TECHNIQUE: Both transabdominal and transvaginal ultrasound examinations were
performed for complete evaluation of the gestation as well as the
maternal uterus, adnexal regions, and pelvic cul-de-sac.
Transvaginal technique was performed to assess early pregnancy.

[Series 1: us ob transvaginal · 7 of 7 slices shown]
[im 1/7]
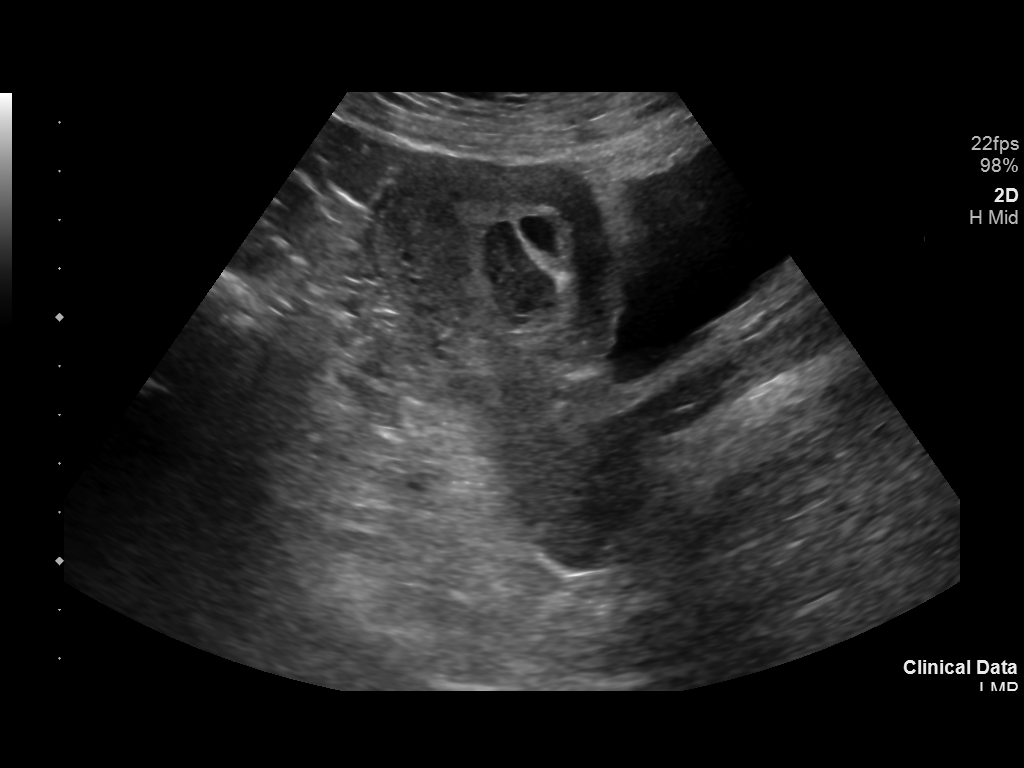
[im 2/7]
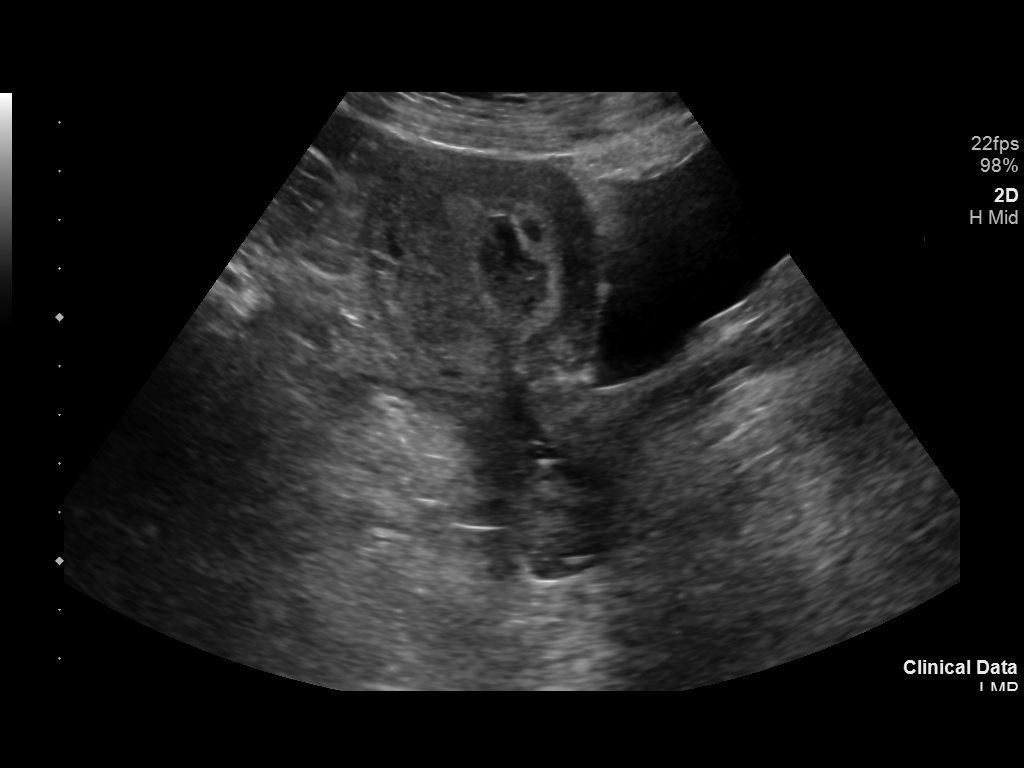
[im 3/7]
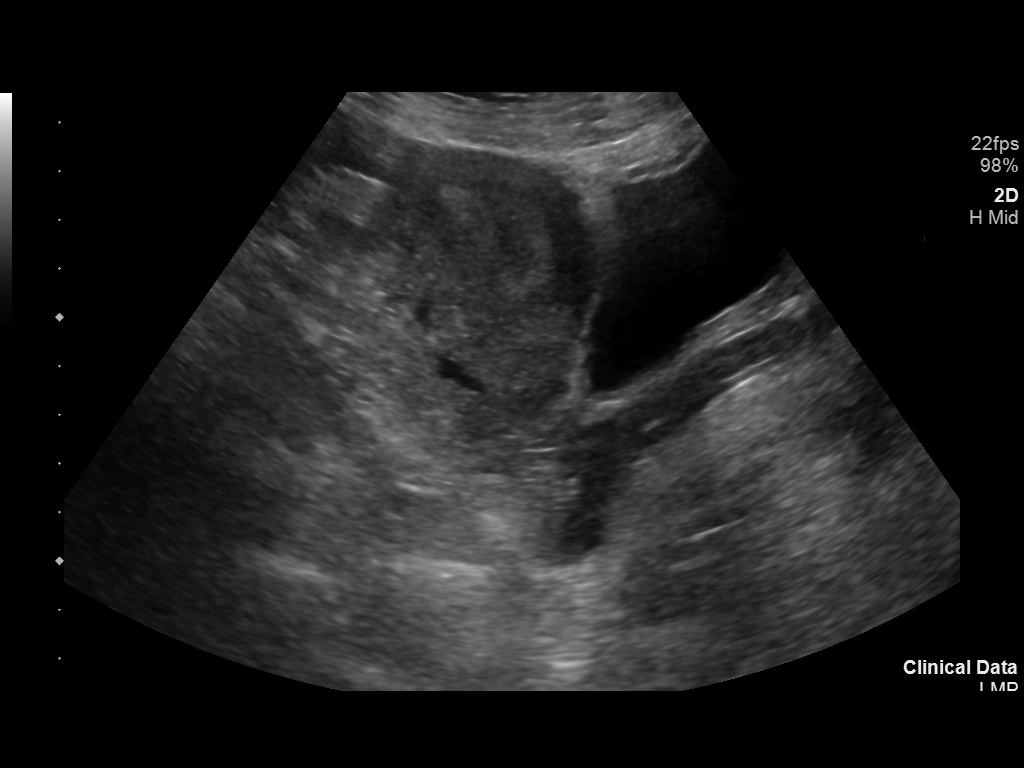
[im 4/7]
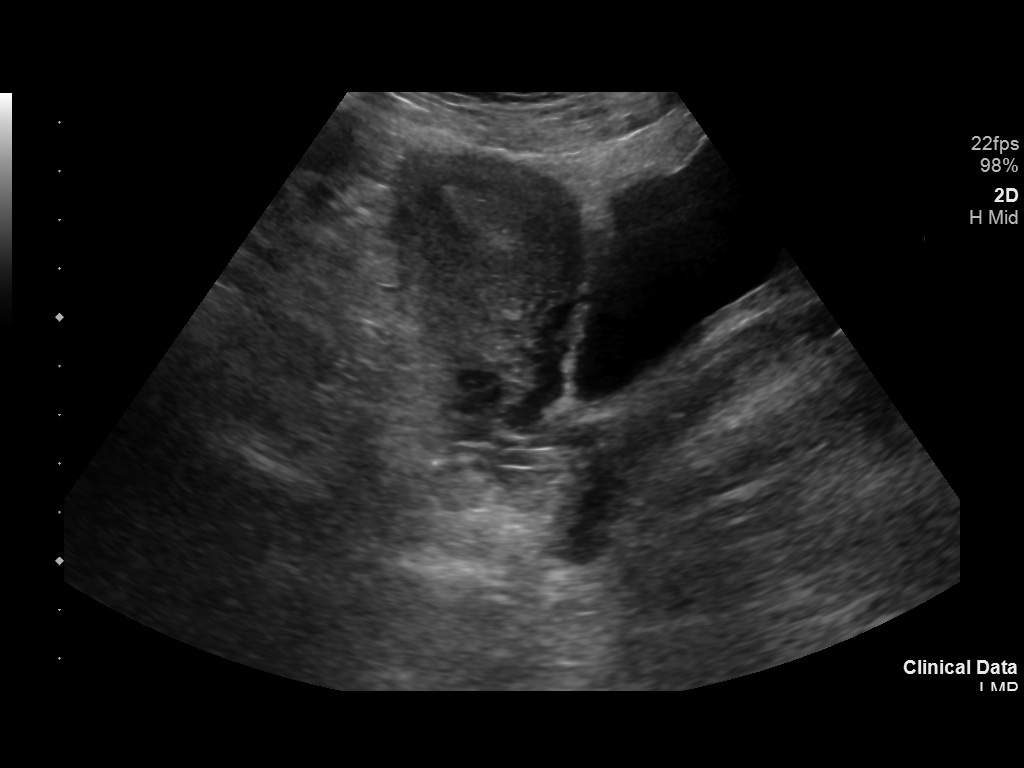
[im 5/7]
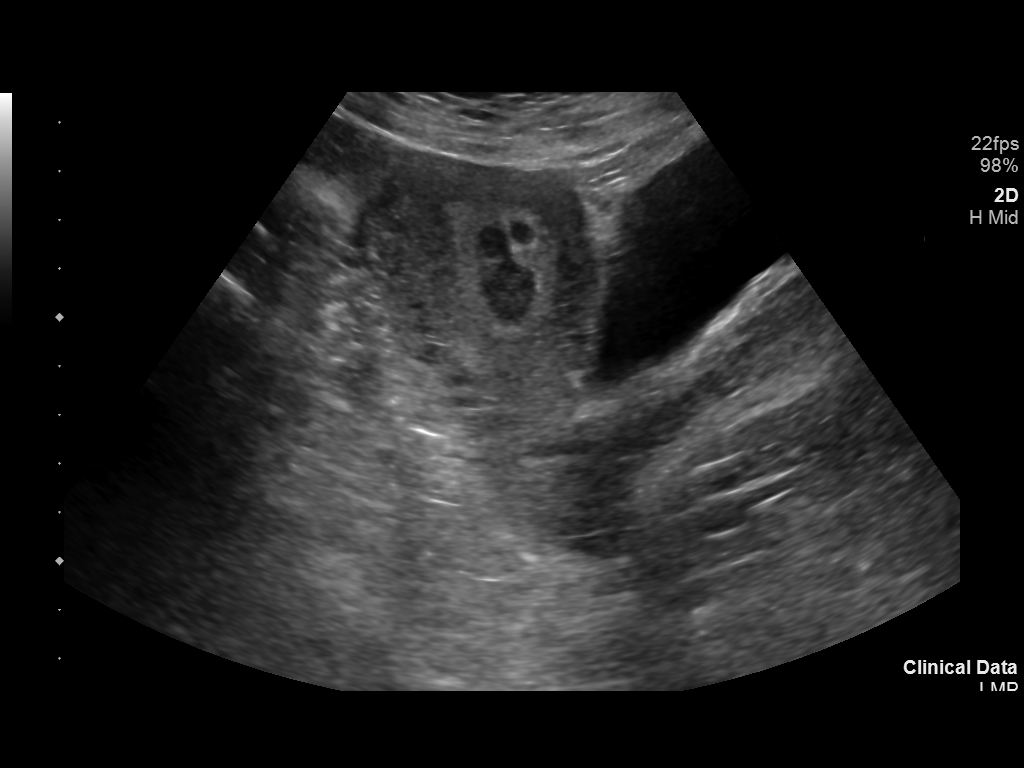
[im 6/7]
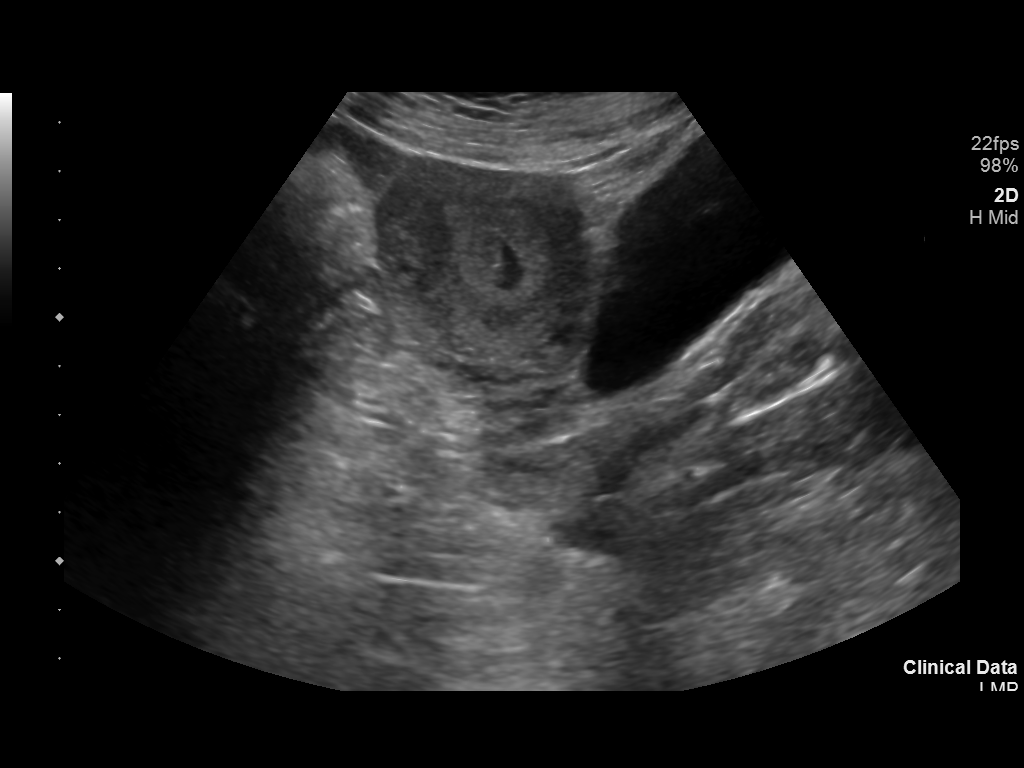
[im 7/7]
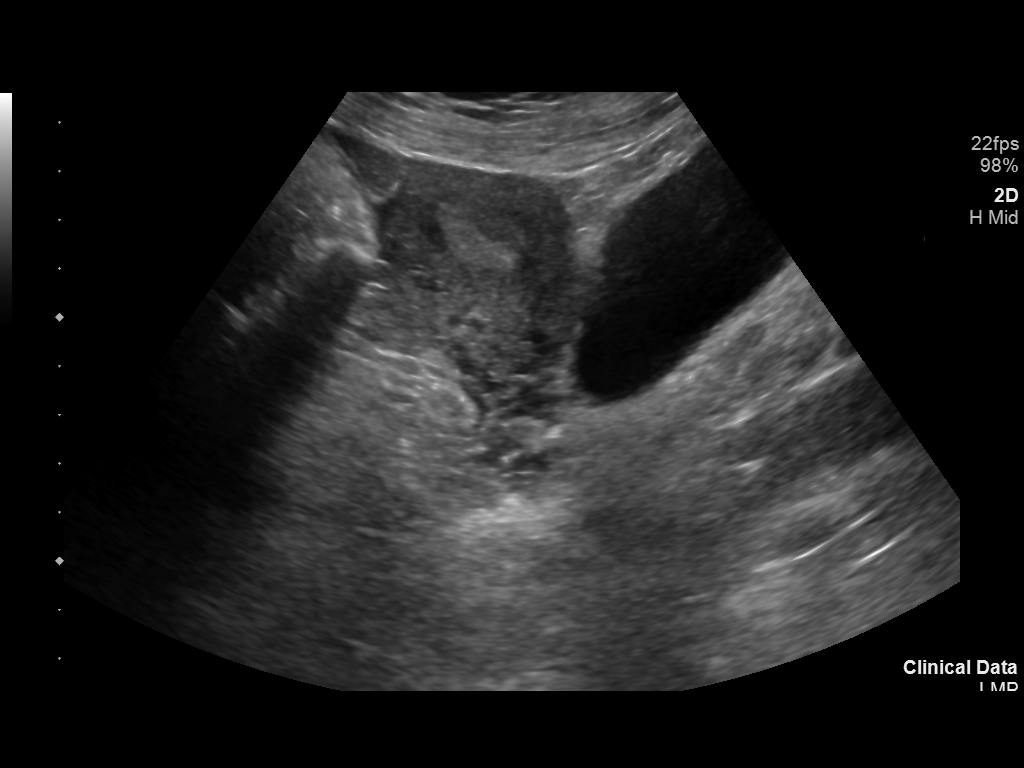

[7 of 7 positions shown; findings below may reference images not displayed]

FINDINGS: Intrauterine gestational sac: Single

Yolk sac:  Visualized.

Embryo:  Not Visualized.

Cardiac Activity: Not Visualized.

MSD: 9.4 mm   5 w   5 d

Subchorionic hemorrhage: Moderate measuring 2.6 x 2.8 x 1.1 cm,
posterior and inferior to the gestational sac.

Maternal uterus/adnexae: Both ovaries are visualized and are normal.
There is a corpus luteal cyst on the left. Blood flow is noted to
both ovaries. No adnexal mass or pelvic free fluid.
IMPRESSION: 1. Intrauterine gestational sac contains a yolk sac but no fetal
pole or cardiac activity demonstrated. Recommend continued trending
of beta HCG and sonographic follow-up as indicated.
2. Moderate subchorionic hemorrhage.
3. No adnexal mass or findings of ectopic pregnancy.

## 2021-07-25 DIAGNOSIS — H6693 Otitis media, unspecified, bilateral: Secondary | ICD-10-CM | POA: Diagnosis not present

## 2021-08-09 DIAGNOSIS — F419 Anxiety disorder, unspecified: Secondary | ICD-10-CM | POA: Diagnosis not present

## 2021-08-09 DIAGNOSIS — R5383 Other fatigue: Secondary | ICD-10-CM | POA: Diagnosis not present

## 2021-08-09 DIAGNOSIS — E559 Vitamin D deficiency, unspecified: Secondary | ICD-10-CM | POA: Diagnosis not present

## 2021-08-09 DIAGNOSIS — K5190 Ulcerative colitis, unspecified, without complications: Secondary | ICD-10-CM | POA: Diagnosis not present

## 2021-11-20 ENCOUNTER — Ambulatory Visit (INDEPENDENT_AMBULATORY_CARE_PROVIDER_SITE_OTHER): Payer: BC Managed Care – PPO

## 2021-11-20 ENCOUNTER — Ambulatory Visit
Admission: EM | Admit: 2021-11-20 | Discharge: 2021-11-20 | Disposition: A | Discharge status: Home / Self Care | Payer: BC Managed Care – PPO | Attending: Family Medicine | Admitting: Family Medicine

## 2021-11-20 DIAGNOSIS — M25561 Pain in right knee: Secondary | ICD-10-CM

## 2021-11-20 DIAGNOSIS — S8001XA Contusion of right knee, initial encounter: Secondary | ICD-10-CM | POA: Diagnosis not present
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
## 2021-12-30 ENCOUNTER — Ambulatory Visit
Admission: EM | Admit: 2021-12-30 | Discharge: 2021-12-30 | Disposition: A | Discharge status: Home / Self Care | Payer: BC Managed Care – PPO | Attending: Nurse Practitioner | Admitting: Nurse Practitioner

## 2021-12-30 ENCOUNTER — Encounter: Payer: Self-pay | Admitting: Emergency Medicine

## 2021-12-30 ENCOUNTER — Ambulatory Visit (INDEPENDENT_AMBULATORY_CARE_PROVIDER_SITE_OTHER): Payer: BC Managed Care – PPO

## 2021-12-30 DIAGNOSIS — R059 Cough, unspecified: Secondary | ICD-10-CM

## 2021-12-30 MED ORDER — ALBUTEROL SULFATE HFA 108 (90 BASE) MCG/ACT IN AERS: 2.0000 | INHALATION_SPRAY | Freq: Four times a day (QID) | RESPIRATORY_TRACT | 0 refills | Status: DC | PRN

## 2021-12-30 NOTE — ED Provider Notes (Signed)
[facility]    CSN: [id number] Arrival date & time: 12/30/21  1724      History   Chief Complaint No chief complaint on file.   HPI Laura Vaughn is a 26 y.o. female.   The history is provided by the patient.   Patient presents for complaints of shortness of breath with cough and wheezing that been present for the past 6 weeks.  Patient states shortness of breath is worse when she is lying down.  She denies any previous history of asthma, seasonal allergies, fever, chills, or productive cough.  Patient reports she is not taking any medication for her symptoms.  Also reports that she does not smoke.  Past Medical History:  Diagnosis Date   ADHD (attention deficit hyperactivity disorder)    Anemia    History of necrotizing fasciitis    vulvar and vaginal after last delivery   Scoliosis    Ulcerative colitis [facility])     Patient Active Problem List   Diagnosis Date Noted   Previous cesarean section 12/16/2020   Status post repeat low transverse cesarean section 12/16/2020   Severe sepsis ([place]) 03/08/2020   Ulcerative colitis, acute ([place]) 03/08/2020   Elevated LFTs 03/08/2020   S/P cesarean section 06/28/2019   Indication for care in labor or delivery 06/28/2019   ADD (attention deficit disorder) without hyperactivity 04/11/2018   Crohn's disease without complication ([place]) 22/63/3354   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    Past Surgical History:  Procedure Laterality Date   ABDOMINAL DEBRIDEMENT  2018   x7   CESAREAN SECTION N/A 06/28/2019   Procedure: CESAREAN SECTION;  Surgeon: Cheri Fowler, MD;  Location: [facility] LD [facility]S;  Service: Obstetrics;  Laterality: N/A;  request RNFA   CESAREAN SECTION N/A 12/16/2020   Procedure: CESAREAN SECTION;  Surgeon: Sherlyn Hay, DO;  Location: [facility] LD [facility]S;  Service: Obstetrics;  Laterality: N/A;  request 2nd scrub tech or RNFA   OSTOMY  2018   ostomy reversal  2020   VAGINA SURGERY      OB History     Gravida   3   Para  3   Term  3   Preterm      AB      Living  3      SAB      IAB      Ectopic      Multiple  0   Live Births  3        Obstetric Comments  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 Authorizing Provider  albuterol (VENTOLIN HFA) 108 (90 Base) MCG/ACT inhaler Inhale 2 puffs into the lungs every 6 (six) hours as needed for wheezing or shortness of breath. 12/30/21  Yes Josiane Labine-Warren, Alda Lea, NP  oxyCODONE (OXY IR/ROXICODONE) 5 MG immediate release tablet Take 1-2 tablets (5-10 mg total) by mouth every 4 (four) hours as needed for moderate pain. 12/18/20   Jerelyn Charles, MD  Prenatal Vit-Fe Fumarate-FA [facility] PRENATAL PO) Take 1 tablet by mouth daily.    [provider]  Probiotic Product (PROBIOTIC DAILY PO) Take 1 capsule by mouth daily.    [provider]    Family History Family History  Problem Relation Age of Onset   ADD / ADHD Sister  Bipolar disorder Paternal Grandmother    ADD / ADHD Sister    Diabetes Father     Social History Social History   Tobacco Use   Smoking status: Never   Smokeless tobacco: Never  Vaping Use   Vaping Use: Never used  Substance Use Topics   Alcohol use: Never   Drug use: Never     Allergies   Almond (diagnostic) and Eggs or egg-derived products   Review of Systems Review of Systems Per HPI  Physical Exam Triage Vital Signs ED Triage Vitals  Enc Vitals Group     BP 12/30/21 1834 119/74     Pulse Rate 12/30/21 1834 95     Resp 12/30/21 1834 18     Temp 12/30/21 1834 98.5 F (36.9 C)     Temp Source 12/30/21 1834 Oral     SpO2 12/30/21 1834 95 %     Weight --      Height --      Head Circumference --      Peak Flow --      Pain Score 12/30/21 1835 0     Pain Loc --      Pain Edu? --      Excl. in [place]? --     No data found.  Updated Vital Signs BP 119/74 (BP Location: Right Arm)   Pulse 95   Temp 98.5 F (36.9 C) (Oral)   Resp 18   LMP 12/08/2021 (Exact Date)   SpO2 95%   Visual Acuity Right Eye Distance:   Left Eye Distance:   Bilateral Distance:    Right Eye Near:   Left Eye Near:    Bilateral Near:     Physical Exam Vitals and nursing note reviewed.  Constitutional:      General: She is not in acute distress.    Appearance: Normal appearance.  HENT:     Head: Normocephalic.     Right Ear: Tympanic membrane, ear canal and external ear normal.     Left Ear: Tympanic membrane, ear canal and external ear normal.     Nose: Nose normal.     Mouth/Throat:     Mouth: Mucous membranes are moist.  Eyes:     Extraocular Movements: Extraocular movements intact.     Conjunctiva/sclera: Conjunctivae normal.     Pupils: Pupils are equal, round, and reactive to light.  Cardiovascular:     Rate and Rhythm: Normal rate and regular rhythm.     Pulses: Normal pulses.     Heart sounds: Normal heart sounds.  Pulmonary:     Effort: Pulmonary effort is normal. No respiratory distress.     Breath sounds: Normal breath sounds. No stridor. No wheezing, rhonchi or rales.  Abdominal:     General: Bowel sounds are normal.     Palpations: Abdomen is soft.     Tenderness: There is no abdominal tenderness.  Musculoskeletal:     Cervical back: Normal range of motion.  Skin:    General: Skin is warm and dry.  Neurological:     General: No focal deficit present.     Mental Status: She is alert and oriented to person, place, and time.  Psychiatric:        Mood and Affect: Mood normal.        Behavior: Behavior normal.      UC Treatments / Results  Labs (all labs ordered are listed, but only abnormal results are displayed) Labs Reviewed - No data to  display  EKG   Radiology DG Chest 2 View  Result Date: 12/30/2021 CLINICAL DATA:  Cough. EXAM: CHEST - 2 VIEW COMPARISON:  March 08, 2020. FINDINGS: The heart size and mediastinal contours are within normal limits. Both lungs are clear. Stable thoracic and lumbar scoliosis. IMPRESSION: No active cardiopulmonary disease. Electronically Signed   By: Marijo Conception M.D.   On: 12/30/2021 19:16    Procedures Procedures (including critical care time)  Medications Ordered in UC Medications - No data to display  Initial Impression / Assessment and Plan / UC Course  I have reviewed the triage vital signs and the nursing notes.  Pertinent labs & imaging results that were available during my care of the patient were reviewed by me and considered in my medical decision making (see chart for details).  The patient is well-appearing, she is in no acute distress, vital signs are stable.  Chest x-ray was negative for any abnormality to suggest pneumonia.  Difficult to ascertain the cause of the patient's shortness of breath and cough for this duration of time.  Patient was prescribed an albuterol inhaler to help with shortness of breath and wheezing.  Patient was given supportive care recommendations along precautions of when to go to the emergency department.  Recommended that the patient follow-up with her primary care physician for further evaluation.  Patient verbalizes understanding.  All questions were answered.  Patient stable for discharge.   Final Clinical Impressions(s) / UC Diagnoses   Final diagnoses:  Cough, unspecified type     Discharge Instructions      The chest x-ray does not show any abnormalities of your lung or anything to suggest why you are wheezing or short of breath. Use the inhaler as needed for shortness of breath or wheezing. If symptoms continue to persist, recommend following up with a primary care physician for further evaluation. Recommend using a humidifier at bedtime to help with cough and/or shortness of breath. Also recommend sleeping elevated on pillows while cough symptoms persist. If  you develop worsening cough, wheezing, shortness of breath, or difficulty breathing, please go to the emergency room for further evaluation. Follow-up as needed.     ED Prescriptions     Medication Sig Dispense Auth. Provider   albuterol (VENTOLIN HFA) 108 (90 Base) MCG/ACT inhaler Inhale 2 puffs into the lungs every 6 (six) hours as needed for wheezing or shortness of breath. 8 g Jacques Fife-Warren, Alda Lea, NP      PDMP not reviewed this encounter.   Tish Men, NP 12/30/21 1931

## 2021-12-30 NOTE — ED Triage Notes (Signed)
Cough x 6 weeks. Feel SOB at times.  States she is wheezing.  States symptoms are worse when laying down

## 2021-12-30 NOTE — Discharge Instructions (Addendum)
The chest x-ray does not show any abnormalities of your lung or anything to suggest why you are wheezing or short of breath. Use the inhaler as needed for shortness of breath or wheezing. If symptoms continue to persist, recommend following up with a primary care physician for further evaluation. Recommend using a humidifier at bedtime to help with cough and/or shortness of breath. Also recommend sleeping elevated on pillows while cough symptoms persist. If you develop worsening cough, wheezing, shortness of breath, or difficulty breathing, please go to the emergency room for further evaluation. Follow-up as needed.

## 2022-02-09 DIAGNOSIS — K5190 Ulcerative colitis, unspecified, without complications: Secondary | ICD-10-CM | POA: Diagnosis not present

## 2022-02-09 DIAGNOSIS — R053 Chronic cough: Secondary | ICD-10-CM | POA: Diagnosis not present

## 2022-02-09 DIAGNOSIS — R5383 Other fatigue: Secondary | ICD-10-CM | POA: Diagnosis not present

## 2022-02-23 DIAGNOSIS — R059 Cough, unspecified: Secondary | ICD-10-CM | POA: Diagnosis not present

## 2022-03-06 ENCOUNTER — Emergency Department (HOSPITAL_COMMUNITY): Payer: BC Managed Care – PPO

## 2022-03-06 ENCOUNTER — Emergency Department (HOSPITAL_COMMUNITY)
Admission: EM | Admit: 2022-03-06 | Discharge: 2022-03-06 | Disposition: A | Discharge status: Home / Self Care | Payer: BC Managed Care – PPO | Attending: Emergency Medicine | Admitting: Emergency Medicine

## 2022-03-06 ENCOUNTER — Other Ambulatory Visit: Payer: Self-pay

## 2022-03-06 DIAGNOSIS — R7401 Elevation of levels of liver transaminase levels: Secondary | ICD-10-CM | POA: Diagnosis not present

## 2022-03-06 DIAGNOSIS — E860 Dehydration: Secondary | ICD-10-CM | POA: Insufficient documentation

## 2022-03-06 DIAGNOSIS — R5381 Other malaise: Secondary | ICD-10-CM | POA: Insufficient documentation

## 2022-03-06 DIAGNOSIS — R000 Tachycardia, unspecified: Secondary | ICD-10-CM | POA: Diagnosis not present

## 2022-03-06 DIAGNOSIS — R5383 Other fatigue: Secondary | ICD-10-CM | POA: Insufficient documentation

## 2022-03-06 DIAGNOSIS — K5190 Ulcerative colitis, unspecified, without complications: Secondary | ICD-10-CM | POA: Insufficient documentation

## 2022-03-06 DIAGNOSIS — R748 Abnormal levels of other serum enzymes: Secondary | ICD-10-CM | POA: Diagnosis not present

## 2022-03-06 LAB — CBC WITH DIFFERENTIAL/PLATELET
Abs Immature Granulocytes: 0.04 10*3/uL (ref 0.00–0.07)
Basophils Absolute: 0.1 10*3/uL (ref 0.0–0.1)
Basophils Relative: 1 %
Eosinophils Absolute: 0.1 10*3/uL (ref 0.0–0.5)
Eosinophils Relative: 1 %
HCT: 47.4 % — ABNORMAL HIGH (ref 36.0–46.0)
Hemoglobin: 15.8 g/dL — ABNORMAL HIGH (ref 12.0–15.0)
Immature Granulocytes: 0 %
Lymphocytes Relative: 16 %
Lymphs Abs: 1.7 10*3/uL (ref 0.7–4.0)
MCH: 29.2 pg (ref 26.0–34.0)
MCHC: 33.3 g/dL (ref 30.0–36.0)
MCV: 87.5 fL (ref 80.0–100.0)
Monocytes Absolute: 0.7 10*3/uL (ref 0.1–1.0)
Monocytes Relative: 6 %
Neutro Abs: 8 10*3/uL — ABNORMAL HIGH (ref 1.7–7.7)
Neutrophils Relative %: 76 %
Platelets: 444 10*3/uL — ABNORMAL HIGH (ref 150–400)
RBC: 5.42 MIL/uL — ABNORMAL HIGH (ref 3.87–5.11)
RDW: 13.8 % (ref 11.5–15.5)
WBC: 10.6 10*3/uL — ABNORMAL HIGH (ref 4.0–10.5)
nRBC: 0 % (ref 0.0–0.2)

## 2022-03-06 LAB — COMPREHENSIVE METABOLIC PANEL
ALT: 65 U/L — ABNORMAL HIGH (ref 0–44)
AST: 38 U/L (ref 15–41)
Albumin: 4.2 g/dL (ref 3.5–5.0)
Alkaline Phosphatase: 129 U/L — ABNORMAL HIGH (ref 38–126)
Anion gap: 9 (ref 5–15)
BUN: 8 mg/dL (ref 6–20)
CO2: 22 mmol/L (ref 22–32)
Calcium: 9.1 mg/dL (ref 8.9–10.3)
Chloride: 101 mmol/L (ref 98–111)
Creatinine, Ser: 0.78 mg/dL (ref 0.44–1.00)
GFR, Estimated: >60 mL/min (ref >60)
Glucose, Bld: 98 mg/dL (ref 70–99)
Potassium: 4 mmol/L (ref 3.5–5.1)
Sodium: 132 mmol/L — ABNORMAL LOW (ref 135–145)
Total Bilirubin: 0.6 mg/dL (ref 0.3–1.2)
Total Protein: 8.6 g/dL — ABNORMAL HIGH (ref 6.5–8.1)

## 2022-03-06 LAB — URINALYSIS, ROUTINE W REFLEX MICROSCOPIC
Bilirubin Urine: NEGATIVE
Glucose, UA: NEGATIVE mg/dL
Hgb urine dipstick: NEGATIVE
Ketones, ur: 20 mg/dL — AB
Nitrite: NEGATIVE
Protein, ur: 30 mg/dL — AB
Specific Gravity, Urine: 1.03 (ref 1.005–1.030)
pH: 5 (ref 5.0–8.0)

## 2022-03-06 LAB — C DIFFICILE QUICK SCREEN W PCR REFLEX
C Diff antigen: NEGATIVE
C Diff interpretation: NEGATIVE
C Diff toxin: NEGATIVE

## 2022-03-06 LAB — I-STAT BETA HCG BLOOD, ED (MC, WL, AP ONLY): I-stat hCG, quantitative: <5 m[IU]/mL (ref <5)

## 2022-03-06 LAB — LIPASE, BLOOD: Lipase: 43 U/L (ref 11–51)

## 2022-03-06 MED ORDER — PREDNISONE 20 MG PO TABS
60.0000 mg | ORAL_TABLET | Freq: Once | ORAL | Status: AC
  Administered 2022-03-06: 60 mg via ORAL
  Filled 2022-03-06: qty 3

## 2022-03-06 MED ORDER — SODIUM CHLORIDE 0.9 % IV BOLUS
1000.0000 mL | Freq: Once | INTRAVENOUS | Status: AC
  Administered 2022-03-06: 1000 mL via INTRAVENOUS

## 2022-03-06 MED ORDER — IOHEXOL 300 MG/ML  SOLN: 100.0000 mL | Freq: Once | INTRAMUSCULAR | Status: DC | PRN

## 2022-03-06 MED ORDER — METHYLPREDNISOLONE 4 MG PO TBPK: ORAL_TABLET | ORAL | 0 refills | Status: DC

## 2022-03-06 NOTE — ED Triage Notes (Signed)
Patient report dehydration. Pt report ulcerative colitis flare up started 2 days ago. Pt report nausea, denies vomiting.

## 2022-03-06 NOTE — ED Provider Triage Note (Signed)
Emergency Medicine Provider Triage Evaluation Note  Laura Vaughn , a 27 y.o. female  was evaluated in triage.  Pt complains of abdominal pain since Thursday.  Patient reports she has had abdominal pain that is similar to a ulcerative colitis flareup.  States pain is across her belly.  Endorses diarrhea, nausea without vomiting.  Denies fever, constipation, blood in stool or urine.  Review of Systems  Positive: As above Negative: As above  Physical Exam  BP 115/82   Pulse (!) 133   Temp 97.8 F (36.6 C) (Oral)   Resp 16   Ht 5' 8"$  (1.727 m)   Wt 79.4 kg   SpO2 100%   BMI 26.62 kg/m  Gen:   Awake, no distress   Resp:  Normal effort  MSK:   Moves extremities without difficulty  Other:    Medical Decision Making  Medically screening exam initiated at 4:29 PM.  Appropriate orders placed.  Belva Aikman was informed that the remainder of the evaluation will be completed by another provider, this initial triage assessment does not replace that evaluation, and the importance of remaining in the ED until their evaluation is complete.    Rex Kras, [place] 03/06/22 2257

## 2022-03-06 NOTE — ED Provider Notes (Signed)
[facility] Provider Note   CSN: [id number] Arrival date & time: 03/06/22  1615     History  Chief Complaint  Patient presents with   Dehydration    Laura Vaughn is a 27 y.o. 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 Believes it to be a UC flare up. Reports nausea without vomiting.  Waxing and waning abdominal discomfort, at its worst she rates it a 7/10.  Is not complaining of any pain at this time.  Had several episodes of diarrhea, denies any bright red or tarry [place] stools.  Reports she has not had a flare in about a year or so.  She is not currently on any medication for this that she said most of them do not agree with her GI tract.  She does not take anything for pain. Says that she has needed prednisone when she has a flare like this but doesn't like taking it if she doesn't have to.   HPI     Home Medications Prior to Admission medications   Medication Sig Start Date End Date Taking? Authorizing Provider  methylPREDNISolone (MEDROL DOSEPAK) 4 MG TBPK tablet Take per package instructions 03/06/22  Yes Monroe Qin T, PA-C  albuterol (VENTOLIN HFA) 108 (90 Base) MCG/ACT inhaler Inhale 2 puffs into the lungs every 6 (six) hours as needed for wheezing or shortness of breath. 12/30/21   Leath-Warren, Alda Lea, NP  oxyCODONE (OXY IR/ROXICODONE) 5 MG immediate release tablet Take 1-2 tablets (5-10 mg total) by mouth every 4 (four) hours as needed for moderate pain. 12/18/20   Jerelyn Charles, MD  Prenatal Vit-Fe Fumarate-FA [facility] PRENATAL PO) Take 1 tablet by mouth daily.    [provider]  Probiotic Product (PROBIOTIC DAILY PO) Take 1 capsule by mouth daily.    [provider]      Allergies    Almond (diagnostic) and Eggs or egg-derived products     Review of Systems   Review of Systems  Constitutional:  Positive for fatigue.  Gastrointestinal:  Positive for abdominal pain, diarrhea and nausea. Negative for blood in stool and vomiting.  All other systems reviewed and are negative.   Physical Exam Updated Vital Signs BP 101/80   Pulse (!) 110   Temp 98 F (36.7 C)   Resp 16   Ht 5' 8"$  (1.727 m)   Wt 79.4 kg   SpO2 98%   BMI 26.62 kg/m  Physical Exam Vitals and nursing note reviewed.  Constitutional:      Appearance: Normal appearance.  HENT:     Head: Normocephalic and atraumatic.  Eyes:     Conjunctiva/sclera: Conjunctivae normal.  Cardiovascular:     Rate and Rhythm: Regular rhythm. Tachycardia present.  Pulmonary:     Effort: Pulmonary effort is normal. No respiratory distress.     Breath sounds: Normal breath sounds.  Abdominal:     General: There is no distension.     Palpations: Abdomen is soft.     Tenderness: There is no abdominal tenderness.  Skin:    General: Skin is warm and dry.  Neurological:     General: No focal deficit present.     Mental Status: She is alert.     ED Results / Procedures / Treatments   Labs (all labs ordered are listed, but only abnormal results are displayed)  Labs Reviewed  CBC WITH DIFFERENTIAL/PLATELET - Abnormal; Notable for the following components:      Result Value   WBC 10.6 (*)    RBC 5.42 (*)    Hemoglobin 15.8 (*)    HCT 47.4 (*)    Platelets 444 (*)    Neutro Abs 8.0 (*)    All other components within normal limits  COMPREHENSIVE METABOLIC PANEL - Abnormal; Notable for the following components:   Sodium 132 (*)    Total Protein 8.6 (*)    ALT 65 (*)    Alkaline Phosphatase 129 (*)    All other components within normal limits  URINALYSIS, ROUTINE W REFLEX MICROSCOPIC - Abnormal; Notable for the following components:   Color, Urine AMBER (*)    APPearance CLOUDY (*)    Ketones, ur 20 (*)    Protein, ur 30 (*)    Leukocytes,Ua SMALL (*)    Bacteria,  UA RARE (*)    All other components within normal limits  C DIFFICILE QUICK SCREEN W PCR REFLEX    LIPASE, BLOOD  I-STAT BETA HCG BLOOD, ED ([facility], [facility], [facility] ONLY)    EKG None  Radiology No results found.  Procedures Procedures    Medications Ordered in ED Medications  iohexol (OMNIPAQUE) 300 MG/ML solution 100 mL (has no administration in time range)  predniSONE (DELTASONE) tablet 60 mg (has no administration in time range)  sodium chloride 0.9 % bolus 1,000 mL (0 mLs Intravenous Stopped 03/06/22 1954)  sodium chloride 0.9 % bolus 1,000 mL (1,000 mLs Intravenous New Bag/Given 03/06/22 2028)    ED Course/ Medical Decision Making/ A&P                             Medical Decision Making Risk Prescription drug management.   This patient is a 27 y.o. female  who presents to the ED for concern of fatigue, abdominal discomfort, dehydration x 2 days.   Differential diagnoses prior to evaluation: The emergent differential diagnosis includes, but is not limited to,  Appendicitis, diverticulitis, DKA, gastroenteritis, pancreatitis, UTI, biliary disease, IBD, chron's, ulcerative, colitis, ectopic pregnancy, PID, ovarian torsion. This is not an exhaustive differential.   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 The pertinent findings include: Tachycardic, but otherwise normal vital signs, afebrile. Abdomen soft and non-tender.   Lab Tests/Imaging studies: I personally interpreted labs/imaging and the pertinent results include: WBC 10.6, hemoglobin 15.8, likely hemoconcentrated.  Sodium 132, otherwise electrolytes within normal limits.  Normal kidney function.  Mildly elevated ALT and alkaline phosphatase, fairly consistent compared to prior.  Normal lipase.  Urinalysis negative for hematuria or infection. C. difficile testing negative.  Negative  pregnancy.  As patient has a nonsurgical abdominal exam and reassuring vital signs, explained to her that I do not think she is requiring CT imaging at this time.  Medications: I ordered medication including IV fluids, prednisone.  I have reviewed the patients home medicines and have made adjustments as needed. Upon reevaluation, patient feels similarly, but heart rate has improved. Passed a PO challenge.   Disposition: After consideration of the diagnostic results and the patients response to treatment, I feel that emergency department workup does not suggest an emergent condition requiring admission or immediate intervention beyond what has been performed at this time. The plan is: discharge to home with prescription for prednisone and encouraged oral  hydration for likely mild UC flare. Follow up with GI. The patient is safe for discharge and has been instructed to return immediately for worsening symptoms, change in symptoms or any other concerns.  Final Clinical Impression(s) / ED Diagnoses Final diagnoses:  Dehydration  Malaise and fatigue  Ulcerative colitis without complications, unspecified location [facility])    Rx / DC Orders ED Discharge Orders          Ordered    methylPREDNISolone (MEDROL DOSEPAK) 4 MG TBPK tablet        03/06/22 2115           Portions of this report may have been transcribed using voice recognition software. Every effort was made to ensure accuracy; however, inadvertent computerized transcription errors may be present.    Estill Cotta 03/06/22 2136    Regan Lemming, MD 03/07/22 [phone]

## 2022-03-06 NOTE — Discharge Instructions (Addendum)
You were seen in the ER for concern for dehydration in the setting of ulcerative colitis flare.   As we discussed, your lab work showed evidence that you are dehydrated, but was overall reassuring.  Your electrolytes are within normal limits.  Your C. difficile testing was negative.  We gave you IV fluids and the initial dose of steroids. I've sent the rest to your pharmacy.  Continue to monitor how you're doing and return to the ER for new or worsening symptoms.

## 2022-03-09 ENCOUNTER — Inpatient Hospital Stay (HOSPITAL_COMMUNITY)
Admission: EM | Admit: 2022-03-09 | Discharge: 2022-03-11 | DRG: 386 | Disposition: A | Discharge status: Home / Self Care | Payer: BC Managed Care – PPO | Attending: Internal Medicine | Admitting: Internal Medicine

## 2022-03-09 ENCOUNTER — Other Ambulatory Visit: Payer: Self-pay

## 2022-03-09 ENCOUNTER — Emergency Department (HOSPITAL_COMMUNITY): Payer: BC Managed Care – PPO

## 2022-03-09 ENCOUNTER — Encounter (HOSPITAL_COMMUNITY): Payer: Self-pay | Admitting: Emergency Medicine

## 2022-03-09 ENCOUNTER — Telehealth: Payer: BC Managed Care – PPO | Admitting: Nurse Practitioner

## 2022-03-09 DIAGNOSIS — D72829 Elevated white blood cell count, unspecified: Secondary | ICD-10-CM | POA: Diagnosis not present

## 2022-03-09 DIAGNOSIS — E871 Hypo-osmolality and hyponatremia: Secondary | ICD-10-CM | POA: Diagnosis present

## 2022-03-09 DIAGNOSIS — K5190 Ulcerative colitis, unspecified, without complications: Secondary | ICD-10-CM | POA: Diagnosis not present

## 2022-03-09 DIAGNOSIS — R739 Hyperglycemia, unspecified: Secondary | ICD-10-CM | POA: Diagnosis not present

## 2022-03-09 DIAGNOSIS — Z79899 Other long term (current) drug therapy: Secondary | ICD-10-CM

## 2022-03-09 DIAGNOSIS — Z8616 Personal history of COVID-19: Secondary | ICD-10-CM | POA: Diagnosis not present

## 2022-03-09 DIAGNOSIS — K51818 Other ulcerative colitis with other complication: Secondary | ICD-10-CM

## 2022-03-09 DIAGNOSIS — Z833 Family history of diabetes mellitus: Secondary | ICD-10-CM | POA: Diagnosis not present

## 2022-03-09 DIAGNOSIS — E8809 Other disorders of plasma-protein metabolism, not elsewhere classified: Secondary | ICD-10-CM | POA: Diagnosis present

## 2022-03-09 DIAGNOSIS — Z818 Family history of other mental and behavioral disorders: Secondary | ICD-10-CM

## 2022-03-09 DIAGNOSIS — K5100 Ulcerative (chronic) pancolitis without complications: Secondary | ICD-10-CM | POA: Diagnosis not present

## 2022-03-09 DIAGNOSIS — K529 Noninfective gastroenteritis and colitis, unspecified: Secondary | ICD-10-CM | POA: Diagnosis not present

## 2022-03-09 DIAGNOSIS — Z1152 Encounter for screening for COVID-19: Secondary | ICD-10-CM | POA: Diagnosis not present

## 2022-03-09 DIAGNOSIS — A020 Salmonella enteritis: Secondary | ICD-10-CM | POA: Diagnosis present

## 2022-03-09 DIAGNOSIS — D75839 Thrombocytosis, unspecified: Secondary | ICD-10-CM | POA: Diagnosis not present

## 2022-03-09 DIAGNOSIS — R109 Unspecified abdominal pain: Secondary | ICD-10-CM | POA: Diagnosis not present

## 2022-03-09 DIAGNOSIS — Z8739 Personal history of other diseases of the musculoskeletal system and connective tissue: Secondary | ICD-10-CM

## 2022-03-09 LAB — CBC
HCT: 42.3 % (ref 36.0–46.0)
Hemoglobin: 13.9 g/dL (ref 12.0–15.0)
MCH: 29.1 pg (ref 26.0–34.0)
MCHC: 32.9 g/dL (ref 30.0–36.0)
MCV: 88.7 fL (ref 80.0–100.0)
Platelets: 504 10*3/uL — ABNORMAL HIGH (ref 150–400)
RBC: 4.77 MIL/uL (ref 3.87–5.11)
RDW: 13.5 % (ref 11.5–15.5)
WBC: 17 10*3/uL — ABNORMAL HIGH (ref 4.0–10.5)
nRBC: 0 % (ref 0.0–0.2)

## 2022-03-09 LAB — LIPASE, BLOOD: Lipase: 38 U/L (ref 11–51)

## 2022-03-09 LAB — URINALYSIS, ROUTINE W REFLEX MICROSCOPIC
Bilirubin Urine: NEGATIVE
Glucose, UA: NEGATIVE mg/dL
Hgb urine dipstick: NEGATIVE
Ketones, ur: NEGATIVE mg/dL
Nitrite: NEGATIVE
Protein, ur: 30 mg/dL — AB
Specific Gravity, Urine: 1.033 — ABNORMAL HIGH (ref 1.005–1.030)
pH: 6 (ref 5.0–8.0)

## 2022-03-09 LAB — COMPREHENSIVE METABOLIC PANEL
ALT: 29 U/L (ref 0–44)
AST: 16 U/L (ref 15–41)
Albumin: 4.1 g/dL (ref 3.5–5.0)
Alkaline Phosphatase: 108 U/L (ref 38–126)
Anion gap: 10 (ref 5–15)
BUN: 12 mg/dL (ref 6–20)
CO2: 26 mmol/L (ref 22–32)
Calcium: 9.1 mg/dL (ref 8.9–10.3)
Chloride: 103 mmol/L (ref 98–111)
Creatinine, Ser: 0.81 mg/dL (ref 0.44–1.00)
GFR, Estimated: >60 mL/min (ref >60)
Glucose, Bld: 117 mg/dL — ABNORMAL HIGH (ref 70–99)
Potassium: 3.7 mmol/L (ref 3.5–5.1)
Sodium: 139 mmol/L (ref 135–145)
Total Bilirubin: 0.5 mg/dL (ref 0.3–1.2)
Total Protein: 7.9 g/dL (ref 6.5–8.1)

## 2022-03-09 LAB — PREGNANCY, URINE: Preg Test, Ur: NEGATIVE

## 2022-03-09 LAB — HCG, QUANTITATIVE, PREGNANCY: hCG, Beta Chain, Quant, S: 1 m[IU]/mL (ref <5)

## 2022-03-09 MED ORDER — HYDROMORPHONE HCL 1 MG/ML IJ SOLN
0.5000 mg | INTRAMUSCULAR | Status: DC | PRN
  Administered 2022-03-11: 0.5 mg via INTRAVENOUS
  Filled 2022-03-09: qty 1

## 2022-03-09 MED ORDER — ACETAMINOPHEN 325 MG PO TABS
650.0000 mg | ORAL_TABLET | Freq: Four times a day (QID) | ORAL | Status: DC | PRN
  Administered 2022-03-10 (×2): 650 mg via ORAL
  Filled 2022-03-09 (×2): qty 2

## 2022-03-09 MED ORDER — CIPROFLOXACIN IN D5W 400 MG/200ML IV SOLN
400.0000 mg | Freq: Once | INTRAVENOUS | Status: AC
  Administered 2022-03-09: 400 mg via INTRAVENOUS
  Filled 2022-03-09: qty 200

## 2022-03-09 MED ORDER — ENOXAPARIN SODIUM 40 MG/0.4ML IJ SOSY
40.0000 mg | PREFILLED_SYRINGE | INTRAMUSCULAR | Status: DC
  Administered 2022-03-10: 40 mg via SUBCUTANEOUS
  Filled 2022-03-09: qty 0.4

## 2022-03-09 MED ORDER — LACTATED RINGERS IV SOLN: INTRAVENOUS | Status: AC

## 2022-03-09 MED ORDER — METRONIDAZOLE 500 MG/100ML IV SOLN
500.0000 mg | Freq: Two times a day (BID) | INTRAVENOUS | Status: DC
  Administered 2022-03-09: 500 mg via INTRAVENOUS
  Filled 2022-03-09: qty 100

## 2022-03-09 MED ORDER — ACETAMINOPHEN 650 MG RE SUPP: 650.0000 mg | Freq: Four times a day (QID) | RECTAL | Status: DC | PRN

## 2022-03-09 MED ORDER — SODIUM CHLORIDE 0.9 % IV BOLUS
1000.0000 mL | Freq: Once | INTRAVENOUS | Status: AC
  Administered 2022-03-09: 1000 mL via INTRAVENOUS

## 2022-03-09 MED ORDER — IOHEXOL 300 MG/ML  SOLN
100.0000 mL | Freq: Once | INTRAMUSCULAR | Status: AC | PRN
  Administered 2022-03-09: 100 mL via INTRAVENOUS

## 2022-03-09 MED ORDER — ONDANSETRON HCL 4 MG/2ML IJ SOLN
4.0000 mg | Freq: Once | INTRAMUSCULAR | Status: AC
  Administered 2022-03-09: 4 mg via INTRAVENOUS
  Filled 2022-03-09: qty 2

## 2022-03-09 MED ORDER — ONDANSETRON HCL 4 MG/2ML IJ SOLN: 4.0000 mg | Freq: Four times a day (QID) | INTRAMUSCULAR | Status: DC | PRN

## 2022-03-09 MED ORDER — MORPHINE SULFATE (PF) 4 MG/ML IV SOLN
4.0000 mg | Freq: Once | INTRAVENOUS | Status: AC
  Administered 2022-03-09: 4 mg via INTRAVENOUS
  Filled 2022-03-09: qty 1

## 2022-03-09 MED ORDER — ONDANSETRON HCL 4 MG PO TABS: 4.0000 mg | ORAL_TABLET | Freq: Four times a day (QID) | ORAL | Status: DC | PRN

## 2022-03-09 MED ORDER — METHYLPREDNISOLONE SODIUM SUCC 125 MG IJ SOLR
60.0000 mg | INTRAMUSCULAR | Status: DC
  Administered 2022-03-09 – 2022-03-10 (×2): 60 mg via INTRAVENOUS
  Filled 2022-03-09 (×2): qty 2

## 2022-03-09 NOTE — ED Triage Notes (Signed)
Pt returns for re-evaluation. C/o abdominal pain that is similar to ulcerative colitis flare ups in the past. S/s ongoing since 2/16 that has not improved and worsening pain.

## 2022-03-09 NOTE — Patient Instructions (Signed)
Based on what you shared with me, I feel your condition warrants further evaluation as soon as possible at an Emergency department.    NOTE: There will be NO CHARGE for this eVisit   If you are having a true medical emergency please call 911.      Emergency Department-[facility]  Get Driving Directions  [phone]  [address]  [place], [place] [address]  Open 24/7/365      [facility] [address]  Get Driving Directions  [address]  [place], [place] [address]  Open 24/7/365    Emergency Department- [facility]  Get Driving Directions  [phone]  [address]  [place], [place] [address]  Open 24/7/365      [facility]  Get Driving Directions  [phone]  [address]  [place], [place] [address]  Open 24/7/365    [place]  Emergency Department- [facility]  Get Driving Directions  [phone]  [address]  [place], [place] [address]  Open 24/7/365    [place]  Emergency Department- [facility] [place]  Get Driving Directions  [address]  [place], [place] [address]  Open 24/7/365    [place]  Emergency Department- [facility]  Get Driving Directions  [phone]  [address]  [place], [place] [address]  Open 24/7/365    

## 2022-03-09 NOTE — H&P (Signed)
History and Physical    Laura Vaughn [id number] DOB: 06/05/1995 DOA: 03/09/2022  PCP: Patient, No Pcp Per   Patient coming from: Home   Chief Complaint: Abdominal pain   HPI: Laura Vaughn is a 27 y.o. female with medical history significant for inflammatory bowel disease, now presenting to the emergency department with abdominal pain.   Patient developed nausea, fatigue, bloody diarrhea, and intermittent abdominal pain for which she was seen in the emergency department on 03/06/2022.  She was presumed to have flare in her IBD and was discharged home with Medrol Dosepak at that time.  She initially had some improvement, but now her pain has become severe again.  She continues to have diarrhea and nausea without vomiting.  She denies fevers, chills, or dysuria.  She was negative for C. difficile colitis on 03/06/2022.  She has not seen a gastroenterologist in over a year but has sought care from Marjie Skiff, a naturopathic doctor.  She was previously followed by GI at [facility] and reports that she had been treated with Lialda, Stelara, Entyvio, and Humira.  ED Course: Upon arrival to the ED, patient is found to be afebrile and saturating well on room air with slightly elevated heart rate and stable blood pressure.  Blood work is notable for WBC 17,000 and platelets 504,000.  CMP is unremarkable.  CT of the abdomen and pelvis is concerning for pancolitis and inflammation at the terminal ileum.  Gastroenterology was consulted by the ED physician and the patient was given a liter of normal saline, morphine, Zofran, ciprofloxacin, and Flagyl.  Review of Systems:  All other systems reviewed and apart from HPI, are negative.  Past Medical History:  Diagnosis Date   ADHD (attention deficit hyperactivity disorder)    Anemia    History of necrotizing fasciitis    vulvar and vaginal after last delivery   Scoliosis    Ulcerative colitis [facility])     Past Surgical History:  Procedure Laterality  Date   ABDOMINAL DEBRIDEMENT  2018   x7   CESAREAN SECTION N/A 06/28/2019   Procedure: CESAREAN SECTION;  Surgeon: Cheri Fowler, MD;  Location: [facility] LD ORS;  Service: Obstetrics;  Laterality: N/A;  request RNFA   CESAREAN SECTION N/A 12/16/2020   Procedure: CESAREAN SECTION;  Surgeon: Sherlyn Hay, DO;  Location: [facility] LD ORS;  Service: Obstetrics;  Laterality: N/A;  request 2nd scrub tech or RNFA   OSTOMY  2018   ostomy reversal  2020   VAGINA SURGERY      Social History:   reports that she has never smoked. She has never used smokeless tobacco. She reports that she does not drink alcohol and does not use drugs.  Allergies  Allergen Reactions   Almond (Diagnostic)    Eggs Or Egg-Derived Products     Family History  Problem Relation Age of Onset   ADD / ADHD Sister    Bipolar disorder Paternal 59    ADD / ADHD Sister    Diabetes Father      Prior to Admission medications   Medication Sig Start Date End Date Taking? Authorizing Provider  methylPREDNISolone (MEDROL DOSEPAK) 4 MG TBPK tablet Take per package instructions 03/06/22  Yes Roemhildt, Lorin T, PA-C  Probiotic Product (PROBIOTIC DAILY PO) Take 1 capsule by mouth daily.   Yes [provider]  albuterol (VENTOLIN HFA) 108 (90 Base) MCG/ACT inhaler Inhale 2 puffs into the lungs every 6 (six) hours as needed for wheezing or shortness of breath.  Patient not taking: Reported on 03/09/2022 12/30/21   Tish Men, NP    Physical Exam: Vitals:   03/09/22 1859 03/09/22 2200 03/09/22 2315 03/10/22 0030  BP: 112/76 112/78    Pulse: (!) 107 73    Resp: 18 18    Temp: 98.8 F (37.1 C)  98.2 F (36.8 C)   TempSrc: Oral  Oral   SpO2: 100% 98%    Weight:    73.5 kg  Height:    5' 8"$  (1.727 m)    Constitutional: NAD, no diaphoresis  Eyes: PERTLA, lids and conjunctivae normal ENMT: Mucous membranes are moist. Posterior pharynx clear of any exudate or lesions.   Neck: supple, no masses   Respiratory: no wheezing, no crackles. No accessory muscle use.  Cardiovascular: S1 & S2 heard, regular rate and rhythm. No extremity edema.   Abdomen: Soft, no distension, no rebound pain or guarding. Bowel sounds active.  Musculoskeletal: no clubbing / cyanosis. No joint deformity upper and lower extremities.   Skin: no significant rashes, lesions, ulcers. Warm, dry, well-perfused. Neurologic: CN 2-12 grossly intact. Moving all extremities. Alert and oriented.  Psychiatric: Calm. Cooperative.    Labs and Imaging on Admission: I have personally reviewed following labs and imaging studies  CBC: Recent Labs  Lab 03/06/22 1628 03/09/22 1934  WBC 10.6* 17.0*  NEUTROABS 8.0*  --   HGB 15.8* 13.9  HCT 47.4* 42.3  MCV 87.5 88.7  PLT 444* [id number]*   Basic Metabolic Panel: Recent Labs  Lab 03/06/22 1628 03/09/22 1934  NA 132* 139  K 4.0 3.7  CL 101 103  CO2 22 26  GLUCOSE 98 117*  BUN 8 12  CREATININE 0.78 0.81  CALCIUM 9.1 9.1   GFR: Estimated Creatinine Clearance: 107.1 mL/min (by C-G formula based on SCr of 0.81 mg/dL). Liver Function Tests: Recent Labs  Lab 03/06/22 1628 03/09/22 1934  AST 38 16  ALT 65* 29  ALKPHOS 129* 108  BILITOT 0.6 0.5  PROT 8.6* 7.9  ALBUMIN 4.2 4.1   Recent Labs  Lab 03/06/22 1628 03/09/22 1934  LIPASE 43 38   No results for input(s): "AMMONIA" in the last 168 hours. Coagulation Profile: No results for input(s): "INR", "PROTIME" in the last 168 hours. Cardiac Enzymes: No results for input(s): "CKTOTAL", "CKMB", "CKMBINDEX", "TROPONINI" in the last 168 hours. BNP (last 3 results) No results for input(s): "PROBNP" in the last 8760 hours. HbA1C: No results for input(s): "HGBA1C" in the last 72 hours. CBG: No results for input(s): "GLUCAP" in the last 168 hours. Lipid Profile: No results for input(s): "CHOL", "HDL", "LDLCALC", "TRIG", "CHOLHDL", "LDLDIRECT" in the last 72 hours. Thyroid Function Tests: No results for input(s):  "TSH", "T4TOTAL", "FREET4", "T3FREE", "THYROIDAB" in the last 72 hours. Anemia Panel: No results for input(s): "VITAMINB12", "FOLATE", "FERRITIN", "TIBC", "IRON", "RETICCTPCT" in the last 72 hours. Urine analysis:    Component Value Date/Time   COLORURINE YELLOW 03/09/2022 2107   APPEARANCEUR HAZY (A) 03/09/2022 2107   LABSPEC 1.033 (H) 03/09/2022 2107   PHURINE 6.0 03/09/2022 2107   GLUCOSEU NEGATIVE 03/09/2022 2107   HGBUR NEGATIVE 03/09/2022 2107   [place] NEGATIVE 03/09/2022 2107   [place] NEGATIVE 03/09/2022 2107   PROTEINUR 30 (A) 03/09/2022 2107   NITRITE NEGATIVE 03/09/2022 2107   LEUKOCYTESUR TRACE (A) 03/09/2022 2107   Sepsis Labs: @LABRCNTIP$ (procalcitonin:4,lacticidven:4) ) Recent Results (from the past 240 hour(s))  C Difficile Quick Screen w PCR reflex     Status: None   Collection Time: 03/06/22  6:53 PM   Specimen: STOOL  Result Value Ref Range Status   C Diff antigen NEGATIVE NEGATIVE Final   C Diff toxin NEGATIVE NEGATIVE Final   C Diff interpretation NEGATIVE  Final    Comment: Performed at [facility], [place] [address] [place], [place] [address]     Radiological Exams on Admission: CT ABDOMEN PELVIS W CONTRAST  Result Date: 03/09/2022 CLINICAL DATA:  Abdominal pain and history of ulcerative colitis and Crohn's disease, initial encounter EXAM: CT ABDOMEN AND PELVIS WITH CONTRAST TECHNIQUE: Multidetector CT imaging of the abdomen and pelvis was performed using the standard protocol following bolus administration of intravenous contrast. RADIATION DOSE REDUCTION: This exam was performed according to the departmental dose-optimization program which includes automated exposure control, adjustment of the mA and/or kV according to patient size and/or use of iterative reconstruction technique. CONTRAST:  140m OMNIPAQUE IOHEXOL 300 MG/ML  SOLN COMPARISON:  03/08/2020 FINDINGS: Lower chest: No acute abnormality. Hepatobiliary: No focal liver  abnormality is seen. No gallstones, gallbladder wall thickening, or biliary dilatation. Pancreas: Unremarkable. No pancreatic ductal dilatation or surrounding inflammatory changes. Spleen: Normal in size without focal abnormality. Adrenals/Urinary Tract: Adrenal glands are within normal limits. Kidneys demonstrate a normal enhancement pattern. No renal calculi or obstructive changes are seen. The bladder is decompressed. Stomach/Bowel: Colon again demonstrates some diffuse wall thickening throughout its course similar to that seen on the prior exam consistent with diffuse colitis. This corresponds with the patient's given clinical history of ulcerative colitis. Stomach is within normal limits. The terminal ileum demonstrates some inflammatory change in hyperemia similar to that seen in the colon. More proximal small bowel appears within normal limits. The appendix is within normal limits. Vascular/Lymphatic: No significant vascular findings are present. No enlarged abdominal or pelvic lymph nodes. Reproductive: Uterus and bilateral adnexa are unremarkable. Other: No abdominal wall hernia or abnormality. No abdominopelvic ascites. Musculoskeletal: No acute or significant osseous findings. Bilateral pars defects are noted at L5 IMPRESSION: Changes consistent with pancolitis as well as inflammatory change in the terminal ileum. These changes are consistent with the patient's given clinical history of ulcerative colitis. No evidence of perforation or abscess formation is seen. Electronically Signed   By: MInez CatalinaM.D.   On: 03/09/2022 22:46     Assessment/Plan  1. Inflammatory bowel disease  - Pt with IBD p/w increased pain and persistent bloody diarrhea after initial relief with Medrol dosepack and has pancolitis and inflamed terminal ileum on CT  - GI consulted by ED MD and IV fluids and IV antibiotics given in ED  - Start IV Solu-Medrol, continue IVF hydration and pain-control    DVT prophylaxis:  Lovenox  Code Status: Full  Level of Care: Level of care: Med-Surg Family Communication: Husband at bedside  Disposition Plan:  Patient is from: Home  Anticipated d/c is to: Home  Anticipated d/c date is: 2/22 or 03/11/22  Patient currently: pending GI consult  Consults called: GI  Admission status: Observation     TVianne Bulls MD [facility]  03/10/2022, 12:40 AM

## 2022-03-09 NOTE — Progress Notes (Signed)
Virtual Visit Consent   Laura Vaughn, you are scheduled for a virtual visit with a [facility] provider today. Just as with appointments in the office, your consent must be obtained to participate. Your consent will be active for this visit and any virtual visit you may have with one of our providers in the next 365 days. If you have a MyChart account, a copy of this consent can be sent to you electronically.  As this is a virtual visit, video technology does not allow for your provider to perform a traditional examination. This may limit your provider's ability to fully assess your condition. If your provider identifies any concerns that need to be evaluated in person or the need to arrange testing (such as labs, EKG, etc.), we will make arrangements to do so. Although advances in technology are sophisticated, we cannot ensure that it will always work on either your end or our end. If the connection with a video visit is poor, the visit may have to be switched to a telephone visit. With either a video or telephone visit, we are not always able to ensure that we have a secure connection.  By engaging in this virtual visit, you consent to the provision of healthcare and authorize for your insurance to be billed (if applicable) for the services provided during this visit. Depending on your insurance coverage, you may receive a charge related to this service.  I need to obtain your verbal consent now. Are you willing to proceed with your visit today? Laura Vaughn has provided verbal consent on 03/09/2022 for a virtual visit (video or telephone). Laura Schneiders, FNP  Date: 03/09/2022 4:00 PM  Virtual Visit via Video Note   I, Laura Vaughn, connected with  Laura Vaughn  ([id number], 07-19-1995) on 03/09/22 at  4:00 PM EST by a video-enabled telemedicine application and verified that I am speaking with the correct person using two identifiers.  Location: Patient: Virtual Visit Location Patient: Home Provider:  Virtual Visit Location Provider: Home Office   I discussed the limitations of evaluation and management by telemedicine and the availability of in person appointments. The patient expressed understanding and agreed to proceed.    History of Present Illness: Laura Vaughn is a 27 y.o. who identifies as a female who was assigned female at birth, and is being seen today for follow up after recent ED visit.She was seen on 03/06/22 for dehydration secondary to UC flare.   At that time she was mostly dehydrated from diarrhea she was not in much pain  Symptom onset was 03/04/22.   She was treated with a Medrol dosepak in ED   She has been diagnosed with UC and Crohn's in the past She has not seen a GI in the past year, most recent major flare was over one year ago    Labs from ED C. Diff negative  Pregnancy negative  Lipase normal  Bump in ALT Alk Phos (65/129) Slight left shift WBC and Neutrophils elevated  + Leuks in urine as well   Labs evidence dehydration questionable start to infectious process Imaging was not done as pain was not severely present at that time   03/07/22 Pain worsened improved somewhat yesterday and then got worse last night and has persisted into today. Pain is coming in waves.  She is continuing to have diarrhea  Denies fever  Denies pain with urination   Pain is central lower abdomen that she describes as cramping that comes in a wave. She feels  a slight improvement in symptoms with medrol but pain has worsened.   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    Allergies:  Allergies  Allergen Reactions   Almond (Diagnostic)    Eggs Or Egg-Derived Products    Medications:  Current Outpatient Medications:    albuterol (VENTOLIN HFA) 108 (90 Base) MCG/ACT inhaler, Inhale 2 puffs into the lungs every 6 (six) hours as needed for wheezing or shortness of breath., Disp: 8 g, Rfl: 0   methylPREDNISolone (MEDROL DOSEPAK) 4 MG TBPK tablet, Take per package instructions, Disp: 1 each, Rfl: 0   oxyCODONE (OXY IR/ROXICODONE) 5 MG immediate release tablet, Take 1-2 tablets (5-10 mg total) by mouth every 4 (four) hours as needed for moderate pain., Disp: 20 tablet, Rfl: 0   Prenatal Vit-Fe Fumarate-FA (GNP PRENATAL PO), Take 1 tablet by mouth daily., Disp: , Rfl:    Probiotic Product (PROBIOTIC DAILY PO), Take 1 capsule by mouth daily., Disp: , Rfl:   Observations/Objective: Patient is well-developed, well-nourished in no acute distress.  Resting comfortably  at home.  Head is normocephalic, atraumatic.  No labored breathing.  Speech is clear and coherent with logical content.  Patient is alert and oriented at baseline.    Assessment and Plan: 1. Other ulcerative colitis with other complication [facility]) Advised ED for follow up  Repeat labs and imaging at this point due to ongoing pain   Patient is agreeable to plan, was seeking reassurance that ED was correct place for further care.   She will return to ED for workup     Follow Up Instructions: I discussed the assessment and treatment plan with the patient. The patient was provided an opportunity to ask questions and all were answered. The patient agreed with the plan and demonstrated an understanding of the instructions.  A copy of instructions were sent to the patient via MyChart unless otherwise noted below.    The patient was advised to call back or seek an in-person  evaluation if the symptoms worsen or if the condition fails to improve as anticipated.  Time:  I spent 15 minutes with the patient via telehealth technology discussing the above problems/concerns.    Laura Schneiders, FNP

## 2022-03-09 NOTE — ED Provider Notes (Addendum)
[facility] Provider Note   CSN: [id number] Arrival date & time: 03/09/22  1853     History  Chief Complaint  Patient presents with   Abdominal Pain    Laura Vaughn is a 27 y.o. female history of ulcerative colitis, here presenting with abdominal pain and blood in her stool.  Patient states that she is followed up with a holistic medicine doctor for her ulcerative colitis.  She states that she had previous colonoscopy that shows ulcerative colitis but is no longer follow-up with any GI doctor.  Patient went to [facility] several days ago and was thought to have ulcerative colitis flare and given steroids.  She is still taking prednisone and noticed worsening lower abdominal pain since yesterday.  Patient also has some bloody stools as well.  Patient states that she did not have any imaging study recently.  She had some vomiting but no fever.  The history is provided by the patient.       Home Medications Prior to Admission medications   Medication Sig Start Date End Date Taking? Authorizing Provider  benzonatate (TESSALON) 100 MG capsule Take 100-200 mg by mouth 3 (three) times daily as needed for cough. 02/09/22  Yes [provider]  HYDROMET 5-1.5 MG/5ML syrup Take 5 mLs by mouth 3 (three) times daily as needed for cough. 02/18/22  Yes [provider]  albuterol (VENTOLIN HFA) 108 (90 Base) MCG/ACT inhaler Inhale 2 puffs into the lungs every 6 (six) hours as needed for wheezing or shortness of breath. 12/30/21   Leath-Warren, Alda Lea, NP  methylPREDNISolone (MEDROL DOSEPAK) 4 MG TBPK tablet Take per package instructions 03/06/22   Roemhildt, Lorin T, PA-C  oxyCODONE (OXY IR/ROXICODONE) 5 MG immediate release tablet Take 1-2 tablets (5-10 mg total) by mouth every 4 (four) hours as needed for moderate pain. 12/18/20   Jerelyn Charles, MD  Prenatal Vit-Fe Fumarate-FA [facility] PRENATAL PO) Take 1 tablet by mouth daily.    [provider]  Probiotic Product (PROBIOTIC DAILY PO) Take 1 capsule by mouth daily.    [provider]      Allergies    Almond (diagnostic) and Eggs or egg-derived products    Review of Systems   Review of Systems  Gastrointestinal:  Positive for abdominal pain and blood in stool.  All other systems reviewed and are negative.   Physical Exam Updated Vital Signs BP 112/78   Pulse 73   Temp 98.8 F (37.1 C) (Oral)   Resp 18   SpO2 98%  Physical Exam Vitals and nursing note reviewed.  Constitutional:      Appearance: She is well-developed.  HENT:     Head: Normocephalic.     Mouth/Throat:     Mouth: Mucous membranes are moist.  Eyes:     Extraocular Movements: Extraocular movements intact.     Pupils: Pupils are equal, round, and reactive to light.  Cardiovascular:     Rate and Rhythm: Normal rate and regular rhythm.  Pulmonary:     Effort: Pulmonary effort is normal.     Breath sounds: Normal breath sounds.  Abdominal:     General: Abdomen is flat.     Comments: Mild diffuse lower abdominal tenderness  Skin:    General: Skin is warm.     Capillary Refill: Capillary refill takes less than 2 seconds.  Neurological:     General: No focal deficit present.     Mental Status: She is alert  and oriented to person, place, and time.  Psychiatric:        Mood and Affect: Mood normal.        Behavior: Behavior normal.     ED Results / Procedures / Treatments   Labs (all labs ordered are listed, but only abnormal results are displayed) Labs Reviewed  COMPREHENSIVE METABOLIC PANEL - Abnormal; Notable for the following components:      Result Value   Glucose, Bld 117 (*)    All other components within normal limits  CBC - Abnormal; Notable for the following components:   WBC 17.0 (*)    Platelets 504 (*)    All other components within normal limits  URINALYSIS, ROUTINE W REFLEX MICROSCOPIC - Abnormal; Notable for the following components:   APPearance HAZY  (*)    Specific Gravity, Urine 1.033 (*)    Protein, ur 30 (*)    Leukocytes,Ua TRACE (*)    Bacteria, UA RARE (*)    All other components within normal limits  LIPASE, BLOOD  PREGNANCY, URINE  HCG, QUANTITATIVE, PREGNANCY    EKG None  Radiology No results found.  Procedures Procedures    Medications Ordered in ED Medications  sodium chloride 0.9 % bolus 1,000 mL (1,000 mLs Intravenous Bolus 03/09/22 2111)  ondansetron (ZOFRAN) injection 4 mg (4 mg Intravenous Given 03/09/22 2110)  morphine (PF) 4 MG/ML injection 4 mg (4 mg Intravenous Given 03/09/22 2111)  iohexol (OMNIPAQUE) 300 MG/ML solution 100 mL (100 mLs Intravenous Contrast Given 03/09/22 2229)    ED Course/ Medical Decision Making/ A&P                             Medical Decision Making Laura Vaughn is a 27 y.o. female history of ulcerative colitis here presenting with possible ulcerative colitis flare.  She has new abdominal pain.  Patient also has some blood in her stool despite steroids.  Plan to get CBC and CMP and CT abdomen pelvis to rule out ulcerative colitis or infection.  10:52 PM Patient white blood cell count is 17 which is increased from recent.  Her hemoglobin is stable at 13.9.  Her CT scan showed pancolitis with inflammatory changes consistent with ulcerative colitis.  She does not have a GI doctor right now and has no follow-up.  At this point, will bring patient in for colonoscopy tomorrow.  I messaged Dr. Michail Sermon from GI to see patient.  Also put patient on Cipro and Flagyl and ordered C. difficile.  Hospitalist to admit.  Problems Addressed: Pancolitis [facility]): acute illness or injury  Amount and/or Complexity of Data Reviewed Labs: ordered. Decision-making details documented in ED Course. Radiology: ordered and independent interpretation performed. Decision-making details documented in ED Course.  Risk Prescription drug management. Decision regarding hospitalization.    Final Clinical  Impression(s) / ED Diagnoses Final diagnoses:  None    Rx / DC Orders ED Discharge Orders     None         Drenda Freeze, MD 03/09/22 2253    Drenda Freeze, MD 03/09/22 [phone]

## 2022-03-09 NOTE — ED Provider Triage Note (Signed)
Emergency Medicine Provider Triage Evaluation Note  Laura Vaughn , a 27 y.o. female  was evaluated in triage.  Pt complains of abdominal pain that is similar to ulcerative colitis flares up.  States symptom has been on since 2/16.  Endorses nausea without vomiting.  States pain is more intense after she was discharged on 2/18.  Pain is intermittent, located across her lower abdomen.  She also reports seeing blood in her stool.  Denies urinary symptoms or vaginal discharge.  Review of Systems  Positive: As above Negative: As above  Physical Exam  BP 112/76 (BP Location: Left Arm)   Pulse (!) 107   Temp 98.8 F (37.1 C) (Oral)   Resp 18   SpO2 100%  Gen:   Awake, no distress   Resp:  Normal effort  MSK:   Moves extremities without difficulty  Other:  TTP to lower abdomen.  Medical Decision Making  Medically screening exam initiated at 7:26 PM.  Appropriate orders placed.  Zeanna Massar was informed that the remainder of the evaluation will be completed by another provider, this initial triage assessment does not replace that evaluation, and the importance of remaining in the ED until their evaluation is complete.     Rex Kras, [place] 03/10/22 [phone]

## 2022-03-09 NOTE — ED Notes (Signed)
Pt is ambulatory to restroom w/out assistance.

## 2022-03-10 DIAGNOSIS — E8809 Other disorders of plasma-protein metabolism, not elsewhere classified: Secondary | ICD-10-CM | POA: Diagnosis present

## 2022-03-10 DIAGNOSIS — K5190 Ulcerative colitis, unspecified, without complications: Secondary | ICD-10-CM | POA: Diagnosis present

## 2022-03-10 DIAGNOSIS — Z79899 Other long term (current) drug therapy: Secondary | ICD-10-CM | POA: Diagnosis not present

## 2022-03-10 DIAGNOSIS — Z833 Family history of diabetes mellitus: Secondary | ICD-10-CM | POA: Diagnosis not present

## 2022-03-10 DIAGNOSIS — Z818 Family history of other mental and behavioral disorders: Secondary | ICD-10-CM | POA: Diagnosis not present

## 2022-03-10 DIAGNOSIS — D75839 Thrombocytosis, unspecified: Secondary | ICD-10-CM | POA: Diagnosis present

## 2022-03-10 DIAGNOSIS — E871 Hypo-osmolality and hyponatremia: Secondary | ICD-10-CM | POA: Diagnosis present

## 2022-03-10 DIAGNOSIS — D72829 Elevated white blood cell count, unspecified: Secondary | ICD-10-CM

## 2022-03-10 DIAGNOSIS — Z1152 Encounter for screening for COVID-19: Secondary | ICD-10-CM | POA: Diagnosis not present

## 2022-03-10 DIAGNOSIS — A020 Salmonella enteritis: Secondary | ICD-10-CM | POA: Diagnosis present

## 2022-03-10 DIAGNOSIS — R109 Unspecified abdominal pain: Secondary | ICD-10-CM | POA: Diagnosis present

## 2022-03-10 DIAGNOSIS — Z8616 Personal history of COVID-19: Secondary | ICD-10-CM | POA: Diagnosis not present

## 2022-03-10 DIAGNOSIS — R739 Hyperglycemia, unspecified: Secondary | ICD-10-CM

## 2022-03-10 DIAGNOSIS — K529 Noninfective gastroenteritis and colitis, unspecified: Secondary | ICD-10-CM | POA: Diagnosis not present

## 2022-03-10 DIAGNOSIS — Z8739 Personal history of other diseases of the musculoskeletal system and connective tissue: Secondary | ICD-10-CM | POA: Diagnosis not present

## 2022-03-10 LAB — CBC
HCT: 38.9 % (ref 36.0–46.0)
Hemoglobin: 12.6 g/dL (ref 12.0–15.0)
MCH: 28.9 pg (ref 26.0–34.0)
MCHC: 32.4 g/dL (ref 30.0–36.0)
MCV: 89.2 fL (ref 80.0–100.0)
Platelets: 454 10*3/uL — ABNORMAL HIGH (ref 150–400)
RBC: 4.36 MIL/uL (ref 3.87–5.11)
RDW: 13.6 % (ref 11.5–15.5)
WBC: 14.3 10*3/uL — ABNORMAL HIGH (ref 4.0–10.5)
nRBC: 0 % (ref 0.0–0.2)

## 2022-03-10 LAB — HEPATIC FUNCTION PANEL
ALT: 24 U/L (ref 0–44)
AST: 13 U/L — ABNORMAL LOW (ref 15–41)
Albumin: 3.3 g/dL — ABNORMAL LOW (ref 3.5–5.0)
Alkaline Phosphatase: 91 U/L (ref 38–126)
Bilirubin, Direct: <0.1 mg/dL (ref 0.0–0.2)
Total Bilirubin: 0.3 mg/dL (ref 0.3–1.2)
Total Protein: 6.6 g/dL (ref 6.5–8.1)

## 2022-03-10 LAB — BASIC METABOLIC PANEL
Anion gap: 9 (ref 5–15)
BUN: 10 mg/dL (ref 6–20)
CO2: 23 mmol/L (ref 22–32)
Calcium: 8.4 mg/dL — ABNORMAL LOW (ref 8.9–10.3)
Chloride: 102 mmol/L (ref 98–111)
Creatinine, Ser: 0.65 mg/dL (ref 0.44–1.00)
GFR, Estimated: >60 mL/min (ref >60)
Glucose, Bld: 129 mg/dL — ABNORMAL HIGH (ref 70–99)
Potassium: 3.7 mmol/L (ref 3.5–5.1)
Sodium: 134 mmol/L — ABNORMAL LOW (ref 135–145)

## 2022-03-10 LAB — C DIFFICILE QUICK SCREEN W PCR REFLEX
C Diff antigen: NEGATIVE
C Diff interpretation: NOT DETECTED
C Diff toxin: NEGATIVE

## 2022-03-10 LAB — SEDIMENTATION RATE: Sed Rate: 14 mm/hr (ref 0–22)

## 2022-03-10 LAB — MAGNESIUM: Magnesium: 2 mg/dL (ref 1.7–2.4)

## 2022-03-10 LAB — C-REACTIVE PROTEIN: CRP: 5.6 mg/dL — ABNORMAL HIGH (ref <1.0)

## 2022-03-10 LAB — HIV ANTIBODY (ROUTINE TESTING W REFLEX): HIV Screen 4th Generation wRfx: NONREACTIVE

## 2022-03-10 MED ORDER — ORAL CARE MOUTH RINSE: 15.0000 mL | OROMUCOSAL | Status: DC | PRN

## 2022-03-10 NOTE — TOC Progression Note (Signed)
Transition of Care [facility]) - Progression Note    Patient Details  Name: Laura Vaughn MRN: [id number] Date of Birth: 1995/07/25  Transition of Care [facility]) CM/SW Contact  Servando Snare, [place] Phone Number: 03/10/2022, 10:45 AM  Clinical Narrative:     Transition of Care (TOC) Screening Note   Patient Details  Name: Laura Vaughn Date of Birth: Sep 11, 1995   Transition of Care [facility]) CM/SW Contact:    Servando Snare, LCSW Phone Number: 03/10/2022, 10:45 AM    Transition of Care Department [facility]) has reviewed patient and no TOC needs have been identified at this time. We will continue to monitor patient advancement through interdisciplinary progression rounds. If new patient transition needs arise, please place a TOC consult.          Expected Discharge Plan and Services                                               Social Determinants of Health (SDOH) Interventions SDOH Screenings   Food Insecurity: No Food Insecurity (03/10/2022)  Housing: Low Risk  (03/10/2022)  Transportation Needs: No Transportation Needs (03/10/2022)  Utilities: Not At Risk (03/10/2022)  Tobacco Use: Low Risk  (03/09/2022)    Readmission Risk Interventions     No data to display

## 2022-03-10 NOTE — Consult Note (Signed)
Referring Provider:  [facility] Primary Care Physician:  Patient, No Pcp Per Primary Gastroenterologist: [facility] GI -Dr. Renee Harder  Reason for Consultation: Ulcerative colitis  HPI: Laura Vaughn is a 27 y.o. female with past medical history of ulcerative colitis as mentioned below was previously followed by Dr. Renee Harder presented to [facility] long ED with abdominal pain.  Patient with history of complicated IBD and looks like she has failed Humira and Entyvio.  She was started on Stelara but according to patient she stopped using after she developed C. difficile.  Patient was also seen in the emergency department few days ago and was started on prednisone taper.  Blood work yesterday showed white count of 17 which could be from recent prednisone use, otherwise normal CBC.  Normal CMP, normal lipase.  C. difficile negative few days ago.  Normal ESR.  Negative urine pregnancy test.  CT abdomen pelvis with IV contrast yesterday showed changes consistent with pancolitis as well as inflammation in the terminal ileum.  Her main symptoms are generalized abdominal discomfort and diarrhea.  Her diarrhea has improved significantly since admission.  Only had 1 bowel movement since last night.   Copy paste of Dr. Janne Napoleon note from May 2022 Laura Vaughn became pregnant in 2018 and during pregnancy had a slight increase in bowel habits to 2-4 times per day.  In September 2018 she had a vaginal delivery that was complicated by Fournier's gangrene.  This required about 7 surgeries including a diverting sigmoid colostomy.  In November 2018 she had a CT scan that showed mild colonic thickening.  In April 2019 she had an outside colonoscopy that revealed "friability and erythema" through the whole colon.  Biopsies revealed active chronic colitis and she was felt to have Crohn's disease.  She was treated with prednisone with no benefit.  She was started on Humira with questionable benefit.  In February 2020 she  still had loose colostomy output and a fecal calprotectin was elevated at 370.  She was switched to [facility].  After one infusion she had a hospitalization for severe abdominal pain and diarrhea and a fever.  She was found to be C. difficile positive and treated with 14 days of oral vancomycin.    Laura Vaughn transferred her care to [facility] at which time she was clinically well off of IBD therapy. A fecal calprotectin was borderline at 60. A colonoscopy showed mild diffuse inflammation proximal to her colostomy and biopsies were consistent with ulcerative colitis. She was started on Lialda and felt well. In August 2020 she underwent takedown of the colostomy. In October 2020, she found out she was pregnant. She did ok through pregnancy but stopped Lialda. She delivered her daughter via C-section in June. After being diagnosed with Covid she developed significant diarrhea and resumed Lialda. She didn't feel that Lialda was beneficial and stopped it and tried to manage symptoms with diet. In October 2021, her fecal calprotectin was normal at 37.   In early 2022, she developed progressive diarrhea, weakness, fevers, and fatigue. She would have clustered bowel movements every few hours with abdominal pain, mucous in stool, and urgency. No bleeding and very rare nocturnal stools. She felt so weak that her husband would carry her to the toilet. She presented to an outside hospital on 03/05/20 for her symptoms and labs showed positive urine ketones, elevated platelets, but otherwise normal including a negative Covid test. A CT A/P showed pan-colitis with a short segment of ileal inflammation. She was advised to follow up  with outpatient GI. She continued to feel poorly and went to [facility] ED on 03/08/20 and was admitted for sepsis. She had an elevated CRP at 179, slightly elevated fecal calprotectin at 138, and a CT demonstrating slight improvement in pan-colitis. On admission, she had elevated LFTs but had normal [place]  and MRCP. C Diff negative. She was treated with prednisone taper and was clinically well with 1 BM daily.  In March she started having some symptoms and was restarted on Lialda. She was never completely well. She planned to undergo colonoscopy in April but found out she was pregnant.  Past Medical History:  Diagnosis Date   ADHD (attention deficit hyperactivity disorder)    Anemia    History of necrotizing fasciitis    vulvar and vaginal after last delivery   Scoliosis    Ulcerative colitis [facility])     Past Surgical History:  Procedure Laterality Date   ABDOMINAL DEBRIDEMENT  2018   x7   CESAREAN SECTION N/A 06/28/2019   Procedure: CESAREAN SECTION;  Surgeon: Laura Fowler, MD;  Location: [facility] LD ORS;  Service: Obstetrics;  Laterality: N/A;  request RNFA   CESAREAN SECTION N/A 12/16/2020   Procedure: CESAREAN SECTION;  Surgeon: Sherlyn Hay, DO;  Location: [facility] LD ORS;  Service: Obstetrics;  Laterality: N/A;  request 2nd scrub tech or RNFA   OSTOMY  2018   ostomy reversal  2020   VAGINA SURGERY      Prior to Admission medications   Medication Sig Start Date End Date Taking? Authorizing Provider  methylPREDNISolone (MEDROL DOSEPAK) 4 MG TBPK tablet Take per package instructions 03/06/22  Yes Roemhildt, Lorin T, PA-C  Probiotic Product (PROBIOTIC DAILY PO) Take 1 capsule by mouth daily.   Yes [provider]  albuterol (VENTOLIN HFA) 108 (90 Base) MCG/ACT inhaler Inhale 2 puffs into the lungs every 6 (six) hours as needed for wheezing or shortness of breath. Patient not taking: Reported on 03/09/2022 12/30/21   Leath-Warren, Alda Lea, NP    Scheduled Meds:  enoxaparin (LOVENOX) injection  40 mg Subcutaneous Q24H   methylPREDNISolone (SOLU-MEDROL) injection  60 mg Intravenous Q24H   Continuous Infusions:  lactated ringers 100 mL/hr at 03/09/22 2357   PRN Meds:.acetaminophen **OR** acetaminophen, HYDROmorphone (DILAUDID) injection, ondansetron **OR** ondansetron  (ZOFRAN) IV  Allergies as of 03/09/2022 - Review Complete 03/09/2022  Allergen Reaction Noted   Almond (diagnostic)  12/16/2020   Eggs or egg-derived products  12/16/2020    Family History  Problem Relation Age of Onset   ADD / ADHD Sister    Bipolar disorder Paternal Grandmother    ADD / ADHD Sister    Diabetes Father     Social History   Socioeconomic History   Marital status: Married    Spouse name: Not on file   Number of children: Not on file   Years of education: Not on file   Highest education level: Not on file  Occupational History   Not on file  Tobacco Use   Smoking status: Never   Smokeless tobacco: Never  Vaping Use   Vaping Use: Never used  Substance and Sexual Activity   Alcohol use: Never   Drug use: Never   Sexual activity: Yes  Other Topics Concern   Not on file  Social History Narrative   Not on file   Social Determinants of Health   Financial Resource Strain: Not on file  Food Insecurity: No Food Insecurity (03/10/2022)   Hunger Vital Sign  Worried About Charity fundraiser in the Last Year: Never true    [place] in the Last Year: Never true  Transportation Needs: No Transportation Needs (03/10/2022)   PRAPARE - Hydrologist (Medical): No    Lack of Transportation (Non-Medical): No  Physical Activity: Not on file  Stress: Not on file  Social Connections: Not on file  Intimate Partner Violence: Not At Risk (03/10/2022)   Humiliation, Afraid, Rape, and Kick questionnaire    Fear of Current or Ex-Partner: No    Emotionally Abused: No    Physically Abused: No    Sexually Abused: No    Review of Systems: All negative except as stated above in HPI.  Physical Exam: Vital signs: Vitals:   03/10/22 0040 03/10/22 0557  BP: 103/77 (!) 97/53  Pulse: 85 69  Resp: 16 18  Temp: 98.4 F (36.9 C) 97.7 F (36.5 C)  SpO2: 97% 97%   Last BM Date : 03/09/22 General:   Alert,  Well-developed, well-nourished,  pleasant and cooperative in NAD HEENT, NS, EOMI Lungs: No visible respiratory distress Heart:  Regular rate and rhythm; no murmurs, clicks, rubs,  or gallops. Abdomen: Soft, central abdominal discomfort on palpation without any significant tenderness, scar marks from previous surgeries noted, peritoneal signs, bowel sounds present Alert and oriented x 3 Mood and affect normal No significant lower extremity edema Rectal:  Deferred  GI:  Lab Results: Recent Labs    03/09/22 1934 03/10/22 0456  WBC 17.0* 14.3*  HGB 13.9 12.6  HCT 42.3 38.9  PLT 504* 454*   BMET Recent Labs    03/09/22 1934 03/10/22 0456  NA 139 134*  K 3.7 3.7  CL 103 102  CO2 26 23  GLUCOSE 117* 129*  BUN 12 10  CREATININE 0.81 0.65  CALCIUM 9.1 8.4*   LFT Recent Labs    03/10/22 0456  PROT 6.6  ALBUMIN 3.3*  AST 13*  ALT 24  ALKPHOS 91  BILITOT 0.3  BILIDIR <0.1  IBILI NOT CALCULATED   PT/INR No results for input(s): "LABPROT", "INR" in the last 72 hours.   Studies/Results: CT ABDOMEN PELVIS W CONTRAST  Result Date: 03/09/2022 CLINICAL DATA:  Abdominal pain and history of ulcerative colitis and Crohn's disease, initial encounter EXAM: CT ABDOMEN AND PELVIS WITH CONTRAST TECHNIQUE: Multidetector CT imaging of the abdomen and pelvis was performed using the standard protocol following bolus administration of intravenous contrast. RADIATION DOSE REDUCTION: This exam was performed according to the departmental dose-optimization program which includes automated exposure control, adjustment of the mA and/or kV according to patient size and/or use of iterative reconstruction technique. CONTRAST:  126m OMNIPAQUE IOHEXOL 300 MG/ML  SOLN COMPARISON:  03/08/2020 FINDINGS: Lower chest: No acute abnormality. Hepatobiliary: No focal liver abnormality is seen. No gallstones, gallbladder wall thickening, or biliary dilatation. Pancreas: Unremarkable. No pancreatic ductal dilatation or surrounding inflammatory  changes. Spleen: Normal in size without focal abnormality. Adrenals/Urinary Tract: Adrenal glands are within normal limits. Kidneys demonstrate a normal enhancement pattern. No renal calculi or obstructive changes are seen. The bladder is decompressed. Stomach/Bowel: Colon again demonstrates some diffuse wall thickening throughout its course similar to that seen on the prior exam consistent with diffuse colitis. This corresponds with the patient's given clinical history of ulcerative colitis. Stomach is within normal limits. The terminal ileum demonstrates some inflammatory change in hyperemia similar to that seen in the colon. More proximal small bowel appears within normal limits. The appendix  is within normal limits. Vascular/Lymphatic: No significant vascular findings are present. No enlarged abdominal or pelvic lymph nodes. Reproductive: Uterus and bilateral adnexa are unremarkable. Other: No abdominal wall hernia or abnormality. No abdominopelvic ascites. Musculoskeletal: No acute or significant osseous findings. Bilateral pars defects are noted at L5 IMPRESSION: Changes consistent with pancolitis as well as inflammatory change in the terminal ileum. These changes are consistent with the patient's given clinical history of ulcerative colitis. No evidence of perforation or abscess formation is seen. Electronically Signed   By: Inez Catalina M.D.   On: 03/09/2022 22:46    Impression/Plan: -Ulcerative colitis flare.  Patient with history of complicated IBD.  Initially diagnosed with Crohn's disease at the time of vaginal delivery in September [id number] which was complicated by Fournier's gangrene required multiple surgeries at that time including diverting sigmoid ostomy.  Was followed by Dr. Koleen Distance after that.  Colonoscopy in 2020 showed finding of ulcerative colitis.  Takedown of colostomy was performed in October 2020.  She has tried mesalamine, Humira Entyvio in the past.  Took 1 dose of Stelara but stopped  it after developing C. difficile 2022 and was lost to follow-up after that.  Recommendations -------------------------- -Discussed different treatment options with the patient.  She does not want to start mesalamine or any other treatment at this time.  She has been not on any medication for her inflammatory bowel disease for more than 1 year.  -She was advised to follow-up with IBD specialist Dr. Renee Harder for management of her complicated inflammatory bowel disease.  -Her symptoms have improved significantly with IV Solu-Medrol.  C. difficile was negative few days ago.    -Change IV Solu-Medrol to oral prednisone tomorrow.  Recommend standard taper of 40 mg once a day for 1 week followed by 30 mg once a day for another week followed by 20 mg once a day for 1 week then remain on 10 mg until seen by Dr. Renee [facility] at [facility].  -Management plan discussed with hospitalist.  No further inpatient GI workup planned.  GI will sign off.  Call us back if needed. Follow up with Dr. Renee Harder at [facility] in 4 weeks after discharge.     LOS: 0 days   Otis Brace  MD, FACP 03/10/2022, 8:31 AM  Contact #  [phone]

## 2022-03-10 NOTE — ED Notes (Signed)
ED TO INPATIENT HANDOFF REPORT  ED Nurse Name and Phone #: Tonette Bihari [phone]  S Name/Age/Gender Laura Vaughn 27 y.o. female Room/Bed: WA14/WA14  Code Status   Code Status: Full Code  Home/SNF/Other Home Patient oriented to: self, place, time, and situation Is this baseline? Yes   Triage Complete: Triage complete  Chief Complaint Inflammatory bowel disease [K52.9]  Triage Note Pt returns for re-evaluation. C/o abdominal pain that is similar to ulcerative colitis flare ups in the past. S/s ongoing since 2/16 that has not improved and worsening pain.    Allergies Allergies  Allergen Reactions   Almond (Diagnostic)    Eggs Or Egg-Derived Products     Level of Care/Admitting Diagnosis ED Disposition     ED Disposition  Admit   Condition  --   Comment  Hospital Area: [place] [[id number]]  Level of Care: Med-Surg [16]  May place patient in observation at [facility] or [place] if equivalent level of care is available:: Yes  Covid Evaluation: Asymptomatic - no recent exposure (last 10 days) testing not required  Diagnosis: Inflammatory bowel disease [id number]  Admitting Physician: Vianne Bulls [id number]  Attending Physician: Vianne Bulls [id number]          B Medical/Surgery History Past Medical History:  Diagnosis Date   ADHD (attention deficit hyperactivity disorder)    Anemia    History of necrotizing fasciitis    vulvar and vaginal after last delivery   Scoliosis    Ulcerative colitis [facility])    Past Surgical History:  Procedure Laterality Date   ABDOMINAL DEBRIDEMENT  2018   x7   CESAREAN SECTION N/A 06/28/2019   Procedure: CESAREAN SECTION;  Surgeon: Cheri Fowler, MD;  Location: [facility] LD ORS;  Service: Obstetrics;  Laterality: N/A;  request RNFA   CESAREAN SECTION N/A 12/16/2020   Procedure: CESAREAN SECTION;  Surgeon: Sherlyn Hay, DO;  Location: [facility] LD ORS;  Service: Obstetrics;  Laterality: N/A;  request 2nd scrub tech  or RNFA   OSTOMY  2018   ostomy reversal  2020   VAGINA SURGERY       A IV Location/Drains/Wounds Patient Lines/Drains/Airways Status     Active Line/Drains/Airways     Name Placement date Placement time Site Days   Peripheral IV 03/09/22 20 G Left Antecubital 03/09/22  2107  Antecubital  1            Intake/Output Last 24 hours  Intake/Output Summary (Last 24 hours) at 03/10/2022 0002 Last data filed at 03/10/2022 0001 Gross per 24 hour  Intake 71.02 ml  Output --  Net 71.02 ml    Labs/Imaging Results for orders placed or performed during the hospital encounter of 03/09/22 (from the past 48 hour(s))  Lipase, blood     Status: None   Collection Time: 03/09/22  7:34 PM  Result Value Ref Range   Lipase 38 11 - 51 U/L    Comment: Performed at [facility], [place] [address], [place], [place] [address]  Comprehensive metabolic panel     Status: Abnormal   Collection Time: 03/09/22  7:34 PM  Result Value Ref Range   Sodium 139 135 - 145 mmol/L   Potassium 3.7 3.5 - 5.1 mmol/L   Chloride 103 98 - 111 mmol/L   CO2 26 22 - 32 mmol/L   Glucose, Bld 117 (H) 70 - 99 mg/dL    Comment: Glucose reference range applies only to samples taken after fasting for at least  8 hours.   BUN 12 6 - 20 mg/dL   Creatinine, Ser 0.81 0.44 - 1.00 mg/dL   Calcium 9.1 8.9 - 10.3 mg/dL   Total Protein 7.9 6.5 - 8.1 g/dL   Albumin 4.1 3.5 - 5.0 g/dL   AST 16 15 - 41 U/L   ALT 29 0 - 44 U/L   Alkaline Phosphatase 108 38 - 126 U/L   Total Bilirubin 0.5 0.3 - 1.2 mg/dL   GFR, Estimated >60 >60 mL/min    Comment: (NOTE) Calculated using the CKD-EPI Creatinine Equation (2021)    Anion gap 10 5 - 15    Comment: Performed at [facility], [place] [address] [place], [place] [address]  CBC     Status: Abnormal   Collection Time: 03/09/22  7:34 PM  Result Value Ref Range   WBC 17.0 (H) 4.0 - 10.5 K/uL   RBC 4.77 3.87 - 5.11 MIL/uL   Hemoglobin 13.9 12.0 - 15.0  g/dL   HCT 42.3 36.0 - 46.0 %   MCV 88.7 80.0 - 100.0 fL   MCH 29.1 26.0 - 34.0 pg   MCHC 32.9 30.0 - 36.0 g/dL   RDW 13.5 11.5 - 15.5 %   Platelets 504 (H) 150 - 400 K/uL   nRBC 0.0 0.0 - 0.2 %    Comment: Performed at [facility], [place] [address], [place], [place] [address]  hCG, quantitative, pregnancy     Status: None   Collection Time: 03/09/22  7:34 PM  Result Value Ref Range   hCG, Beta Chain, Quant, S 1 <5 mIU/mL    Comment:          GEST. AGE      CONC.  (mIU/mL)   <=1 WEEK        5 - 50     2 WEEKS       50 - 500     3 WEEKS       100 - 10,000     4 WEEKS     1,000 - 30,000     5 WEEKS     3,500 - 115,000   6-8 WEEKS     12,000 - 270,000    12 WEEKS     15,000 - 220,000        FEMALE AND NON-PREGNANT FEMALE:     LESS THAN 5 mIU/mL Performed at [facility], [place] [address] [place], [place] [address]   Urinalysis, Routine w reflex microscopic -Urine, Clean Catch     Status: Abnormal   Collection Time: 03/09/22  9:07 PM  Result Value Ref Range   Color, Urine YELLOW YELLOW   APPearance HAZY (A) CLEAR   Specific Gravity, Urine 1.033 (H) 1.005 - 1.030   pH 6.0 5.0 - 8.0   Glucose, UA NEGATIVE NEGATIVE mg/dL   Hgb urine dipstick NEGATIVE NEGATIVE   Bilirubin Urine NEGATIVE NEGATIVE   Ketones, ur NEGATIVE NEGATIVE mg/dL   Protein, ur 30 (A) NEGATIVE mg/dL   Nitrite NEGATIVE NEGATIVE   Leukocytes,Ua TRACE (A) NEGATIVE   RBC / HPF 0-5 0 - 5 RBC/hpf   WBC, UA 0-5 0 - 5 WBC/hpf   Bacteria, UA RARE (A) NONE SEEN   Squamous Epithelial / HPF 6-10 0 - 5 /HPF   Mucus PRESENT     Comment: Performed at [facility], [place] [address], [place], [place] [address]  Pregnancy, urine     Status: None  Collection Time: 03/09/22  9:07 PM  Result Value Ref Range   Preg Test, Ur NEGATIVE NEGATIVE    Comment:        THE SENSITIVITY OF THIS METHODOLOGY IS >20 mIU/mL. Performed at [facility], [place] [address] [place], [place] [address]    CT ABDOMEN PELVIS W CONTRAST  Result Date: 03/09/2022 CLINICAL DATA:  Abdominal pain and history of ulcerative colitis and Crohn's disease, initial encounter EXAM: CT ABDOMEN AND PELVIS WITH CONTRAST TECHNIQUE: Multidetector CT imaging of the abdomen and pelvis was performed using the standard protocol following bolus administration of intravenous contrast. RADIATION DOSE REDUCTION: This exam was performed according to the departmental dose-optimization program which includes automated exposure control, adjustment of the mA and/or kV according to patient size and/or use of iterative reconstruction technique. CONTRAST:  159m OMNIPAQUE IOHEXOL 300 MG/ML  SOLN COMPARISON:  03/08/2020 FINDINGS: Lower chest: No acute abnormality. Hepatobiliary: No focal liver abnormality is seen. No gallstones, gallbladder wall thickening, or biliary dilatation. Pancreas: Unremarkable. No pancreatic ductal dilatation or surrounding inflammatory changes. Spleen: Normal in size without focal abnormality. Adrenals/Urinary Tract: Adrenal glands are within normal limits. Kidneys demonstrate a normal enhancement pattern. No renal calculi or obstructive changes are seen. The bladder is decompressed. Stomach/Bowel: Colon again demonstrates some diffuse wall thickening throughout its course similar to that seen on the prior exam consistent with diffuse colitis. This corresponds with the patient's given clinical history of ulcerative colitis. Stomach is within normal limits. The terminal ileum demonstrates some inflammatory change in hyperemia similar to that seen in the colon. More proximal small bowel appears within normal limits. The appendix is within normal limits. Vascular/Lymphatic: No significant vascular findings are present. No enlarged abdominal or pelvic lymph nodes. Reproductive: Uterus and bilateral adnexa are unremarkable. Other: No abdominal wall hernia or abnormality. No abdominopelvic ascites.  Musculoskeletal: No acute or significant osseous findings. Bilateral pars defects are noted at L5 IMPRESSION: Changes consistent with pancolitis as well as inflammatory change in the terminal ileum. These changes are consistent with the patient's given clinical history of ulcerative colitis. No evidence of perforation or abscess formation is seen. Electronically Signed   By: MInez CatalinaM.D.   On: 03/09/2022 22:46    Pending Labs Unresulted Labs (From admission, onward)     Start     Ordered   03/16/22 0500  Creatinine, serum  (enoxaparin (LOVENOX)    CrCl >/= 30 ml/min)  Weekly,   R     Comments: while on enoxaparin therapy    03/09/22 2339   03/10/22 0500  HIV Antibody (routine testing w rflx)  (HIV Antibody (Routine testing w reflex) panel)  Tomorrow morning,   R        03/09/22 2339   03/10/22 [id number] Basic metabolic panel  Daily,   R      03/09/22 2339   03/10/22 0500  Hepatic function panel  Tomorrow morning,   R        03/09/22 2339   03/10/22 0500  Magnesium  Tomorrow morning,   R        03/09/22 2339   03/10/22 0500  CBC  Daily,   R      03/09/22 2339   03/09/22 2337  Gastrointestinal Panel by PCR , Stool  (Gastrointestinal Panel by PCR, Stool                                                                                                                                                     **  Does Not include CLOSTRIDIUM DIFFICILE testing. **If CDIFF testing is needed, place order from the "C Difficile Testing" order set.**)  Once,   R        03/09/22 2336   03/09/22 2249  C Difficile Quick Screen w PCR reflex  (C Difficile quick screen w PCR reflex panel )  Once, for 24 hours,   URGENT       References:    CDiff Information Tool   03/09/22 2249            Vitals/Pain Today's Vitals   03/09/22 1906 03/09/22 2200 03/09/22 2315 03/09/22 2315  BP:  112/78    Pulse:  73    Resp:  18    Temp:   98.2 F (36.8 C)   TempSrc:   Oral   SpO2:  98%    PainSc: 3    0-No pain     Isolation Precautions Enteric precautions (UV disinfection)  Medications Medications  ciprofloxacin (CIPRO) IVPB 400 mg (400 mg Intravenous New Bag/Given 03/09/22 2309)  enoxaparin (LOVENOX) injection 40 mg (has no administration in time range)  acetaminophen (TYLENOL) tablet 650 mg (has no administration in time range)    Or  acetaminophen (TYLENOL) suppository 650 mg (has no administration in time range)  lactated ringers infusion ( Intravenous New Bag/Given 03/09/22 2357)  HYDROmorphone (DILAUDID) injection 0.5-1 mg (has no administration in time range)  ondansetron (ZOFRAN) tablet 4 mg (has no administration in time range)    Or  ondansetron (ZOFRAN) injection 4 mg (has no administration in time range)  methylPREDNISolone sodium succinate (SOLU-MEDROL) 125 mg/2 mL injection 60 mg (60 mg Intravenous Given 03/09/22 2357)  sodium chloride 0.9 % bolus 1,000 mL (1,000 mLs Intravenous Bolus 03/09/22 2111)  ondansetron (ZOFRAN) injection 4 mg (4 mg Intravenous Given 03/09/22 2110)  morphine (PF) 4 MG/ML injection 4 mg (4 mg Intravenous Given 03/09/22 2111)  iohexol (OMNIPAQUE) 300 MG/ML solution 100 mL (100 mLs Intravenous Contrast Given 03/09/22 2229)    Mobility walks     Focused Assessments    R Recommendations: See Admitting Provider Note  Report given to:   Additional Notes:

## 2022-03-10 NOTE — Progress Notes (Signed)
PROGRESS NOTE    Laura Vaughn  [id number] DOB: 07-Jun-1995 DOA: 03/09/2022 PCP: Patient, No Pcp Per   Brief Narrative:   HPI per Dr. Mitzi Hansen on 03/09/22 Laura Vaughn is a 27 y.o. female with medical history significant for inflammatory bowel disease, now presenting to the emergency department with abdominal pain.    Patient developed nausea, fatigue, bloody diarrhea, and intermittent abdominal pain for which she was seen in the emergency department on 03/06/2022.  She was presumed to have flare in her IBD and was discharged home with Medrol Dosepak at that time.  She initially had some improvement, but now her pain has become severe again.  She continues to have diarrhea and nausea without vomiting.  She denies fevers, chills, or dysuria.  She was negative for C. difficile colitis on 03/06/2022.   She has not seen a gastroenterologist in over a year but has sought care from Marjie Skiff, a naturopathic doctor.  She was previously followed by GI at [facility] and reports that she had been treated with Lialda, Stelara, Entyvio, and Humira.   ED Course: Upon arrival to the ED, patient is found to be afebrile and saturating well on room air with slightly elevated heart rate and stable blood pressure.  Blood work is notable for WBC 17,000 and platelets 504,000.  CMP is unremarkable.  CT of the abdomen and pelvis is concerning for pancolitis and inflammation at the terminal ileum.   Gastroenterology was consulted by the ED physician and the patient was given a liter of normal saline, morphine, Zofran, ciprofloxacin, and Flagyl.  **Interim History Was consulted and antibiotics have been discontinued.  Will continue IV Solu-Medrol and then transition to oral steroids tomorrow given that she is slowly improving  Assessment and Plan: No notes have been filed under this hospital service. Service: Hospitalist  Inflammatory bowel disease with ulcerative colitis flare -Pt with IBD who was previously  followed by Dr. Koleen Distance p/w increased pain and persistent bloody diarrhea after initial relief with Medrol dosepack and has pancolitis and inflamed terminal ileum on CT  -GI consulted by ED MD and IV fluids and IV antibiotics given in ED  -She remains on IV fluids with lactated Ringer's at 100 MLS per hour -Apparently she had initially been diagnosed with Crohn's disease at the time of her vaginal delivery in September [id number] which was complicated by Fournier's gangrene and required multiple surgeries at that time including a diverting sigmoid ostomy and then a colonoscopy in 2020 showed findings of ulcerative colitis.  Had a takedown of the colostomy that was performed in October 2020 and she has been trialed on mesalamine, Humira, Entyvio in the past and took 1 dose of Stelara but was stopped after she developed C. difficile in 2022 and was lost to follow-up - Start IV Solu-Medrol and remains on 60 mg IV every 24, continue IVF hydration and pain-control with acetaminophen 650 mg p.o./RC every 6 as needed, hydromorphone 0.5 to 1 mg every 3 hours as needed severe pain -Per my discussion with GI, GI recommends continue IV Solu-Medrol and then changing to oral prednisone tomorrow.  Recommending a standard taper of 40 mg once a day for a week followed by 30 mg once a day for a week followed by 20 mg once a day for a week and then 10 mg until seen by Dr. Santo Held at [facility]. -Patient does not want start mesalamine or any treatment at this time  Leukocytosis -WBC Trend: Recent Labs  Lab 03/06/22  1628 03/09/22 1934 03/10/22 0456  WBC 10.6* 17.0* 14.3*  -In the setting of IBD Flare and Steroid Demargination from Medrol Dosepak -Continue to Monitor for S/Sx of Infection;  -Repeat CBC in the AM   Hyperglycemia -In the setting of steroid demargination -Check HbA1c in the AM  -Continue to Monitor Blood Sugars carefully and if necessary will place on Sensitive Novolog SSI AC  Hyponatremia -Na+  Trend: Recent Labs  Lab 03/06/22 1628 03/09/22 1934 03/10/22 0456  NA 132* 139 134*  -Continue to Monitor and Trend and Repeat CMP in the AM   Thrombocytosis -Likely Reactive Recent Labs  Lab 03/06/22 1628 03/09/22 1934 03/10/22 0456  PLT 444* 504* 454*  -Continue to Monitor and Trend and repeat CBC in the AM  Hypoalbuminemia -Patient's Albumin Trend: Recent Labs  Lab 03/06/22 1628 03/09/22 1934 03/10/22 0456  ALBUMIN 4.2 4.1 3.3*  -Continue to Monitor and Trend and repeat CMP in the AM  DVT prophylaxis: enoxaparin (LOVENOX) injection 40 mg Start: 03/10/22 1000    Code Status: Full Code Family Communication: No family currently at bedside  Disposition Plan:  Level of care: Med-Surg Status is: Observation The patient remains OBS appropriate and will d/c before 2 midnights.   Consultants:  Gastroenterology  Procedures:  As delineated as above  Antimicrobials:  Anti-infectives (From admission, onward)    Start     Dose/Rate Route Frequency Ordered Stop   03/09/22 2300  ciprofloxacin (CIPRO) IVPB 400 mg        400 mg 200 mL/hr over 60 Minutes Intravenous  Once 03/09/22 2248 03/10/22 0009   03/09/22 2300  metroNIDAZOLE (FLAGYL) IVPB 500 mg  Status:  Discontinued        500 mg 100 mL/hr over 60 Minutes Intravenous Every 12 hours 03/09/22 2248 03/09/22 2339       Subjective: Seen and examined at bedside and she is doing better.  She states that her abdominal pain is not as bad.  No nausea or vomiting.  No other concerns at present time.  Objective: Vitals:   03/09/22 2315 03/10/22 0030 03/10/22 0040 03/10/22 0557  BP:   103/77 (!) 97/53  Pulse:   85 69  Resp:   16 18  Temp: 98.2 F (36.8 C)  98.4 F (36.9 C) 97.7 F (36.5 C)  TempSrc: Oral  Oral Oral  SpO2:   97% 97%  Weight:  73.5 kg    Height:  5' 8"$  (1.727 m)      Intake/Output Summary (Last 24 hours) at 03/10/2022 0758 Last data filed at 03/10/2022 0600 Gross per 24 hour  Intake 514.56 ml   Output 400 ml  Net 114.56 ml   Filed Weights   03/10/22 0030  Weight: 73.5 kg   Examination: Physical Exam:  Constitutional: WN/WD Caucasian female in no acute distress Respiratory: Clear to auscultation bilaterally, no wheezing, rales, rhonchi or crackles. Normal respiratory effort and patient is not tachypenic. No accessory muscle use.  Unlabored breathing Cardiovascular: RRR, no murmurs / rubs / gallops. S1 and S2 auscultated. No extremity edema.  Abdomen: Soft, mildly-tender, non-distended. Bowel sounds positive.  GU: Deferred. Musculoskeletal: No clubbing / cyanosis of digits/nails. No joint deformity upper and lower extremities. Skin: No rashes, lesions, ulcers on limited skin evaluation. No induration; Warm and dry.  Neurologic: CN 2-12 grossly intact with no focal deficits. Romberg sign and cerebellar reflexes not assessed.  Psychiatric: Normal judgment and insight. Alert and oriented x 3. Normal mood and appropriate affect.   Data  Reviewed: I have personally reviewed following labs and imaging studies  CBC: Recent Labs  Lab 03/06/22 1628 03/09/22 1934 03/10/22 0456  WBC 10.6* 17.0* 14.3*  NEUTROABS 8.0*  --   --   HGB 15.8* 13.9 12.6  HCT 47.4* 42.3 38.9  MCV 87.5 88.7 89.2  PLT 444* 504* [id number]*   Basic Metabolic Panel: Recent Labs  Lab 03/06/22 1628 03/09/22 1934 03/10/22 0456  NA 132* 139 134*  K 4.0 3.7 3.7  CL 101 103 102  CO2 22 26 23  $ GLUCOSE 98 117* 129*  BUN 8 12 10  $ CREATININE 0.78 0.81 0.65  CALCIUM 9.1 9.1 8.4*  MG  --   --  2.0   GFR: Estimated Creatinine Clearance: 108.4 mL/min (by C-G formula based on SCr of 0.65 mg/dL). Liver Function Tests: Recent Labs  Lab 03/06/22 1628 03/09/22 1934 03/10/22 0456  AST 38 16 13*  ALT 65* 29 24  ALKPHOS 129* 108 91  BILITOT 0.6 0.5 0.3  PROT 8.6* 7.9 6.6  ALBUMIN 4.2 4.1 3.3*   Recent Labs  Lab 03/06/22 1628 03/09/22 1934  LIPASE 43 38   No results for input(s): "AMMONIA" in the last 168  hours. Coagulation Profile: No results for input(s): "INR", "PROTIME" in the last 168 hours. Cardiac Enzymes: No results for input(s): "CKTOTAL", "CKMB", "CKMBINDEX", "TROPONINI" in the last 168 hours. BNP (last 3 results) No results for input(s): "PROBNP" in the last 8760 hours. HbA1C: No results for input(s): "HGBA1C" in the last 72 hours. CBG: No results for input(s): "GLUCAP" in the last 168 hours. Lipid Profile: No results for input(s): "CHOL", "HDL", "LDLCALC", "TRIG", "CHOLHDL", "LDLDIRECT" in the last 72 hours. Thyroid Function Tests: No results for input(s): "TSH", "T4TOTAL", "FREET4", "T3FREE", "THYROIDAB" in the last 72 hours. Anemia Panel: No results for input(s): "VITAMINB12", "FOLATE", "FERRITIN", "TIBC", "IRON", "RETICCTPCT" in the last 72 hours. Sepsis Labs: No results for input(s): "PROCALCITON", "LATICACIDVEN" in the last 168 hours.  Recent Results (from the past 240 hour(s))  C Difficile Quick Screen w PCR reflex     Status: None   Collection Time: 03/06/22  6:53 PM   Specimen: STOOL  Result Value Ref Range Status   C Diff antigen NEGATIVE NEGATIVE Final   C Diff toxin NEGATIVE NEGATIVE Final   C Diff interpretation NEGATIVE  Final    Comment: Performed at [facility], [place] [address] [place], [place] [address]    Radiology Studies: CT ABDOMEN PELVIS W CONTRAST  Result Date: 03/09/2022 CLINICAL DATA:  Abdominal pain and history of ulcerative colitis and Crohn's disease, initial encounter EXAM: CT ABDOMEN AND PELVIS WITH CONTRAST TECHNIQUE: Multidetector CT imaging of the abdomen and pelvis was performed using the standard protocol following bolus administration of intravenous contrast. RADIATION DOSE REDUCTION: This exam was performed according to the departmental dose-optimization program which includes automated exposure control, adjustment of the mA and/or kV according to patient size and/or use of iterative reconstruction technique.  CONTRAST:  17m OMNIPAQUE IOHEXOL 300 MG/ML  SOLN COMPARISON:  03/08/2020 FINDINGS: Lower chest: No acute abnormality. Hepatobiliary: No focal liver abnormality is seen. No gallstones, gallbladder wall thickening, or biliary dilatation. Pancreas: Unremarkable. No pancreatic ductal dilatation or surrounding inflammatory changes. Spleen: Normal in size without focal abnormality. Adrenals/Urinary Tract: Adrenal glands are within normal limits. Kidneys demonstrate a normal enhancement pattern. No renal calculi or obstructive changes are seen. The bladder is decompressed. Stomach/Bowel: Colon again demonstrates some diffuse wall thickening throughout its course similar to that seen on  the prior exam consistent with diffuse colitis. This corresponds with the patient's given clinical history of ulcerative colitis. Stomach is within normal limits. The terminal ileum demonstrates some inflammatory change in hyperemia similar to that seen in the colon. More proximal small bowel appears within normal limits. The appendix is within normal limits. Vascular/Lymphatic: No significant vascular findings are present. No enlarged abdominal or pelvic lymph nodes. Reproductive: Uterus and bilateral adnexa are unremarkable. Other: No abdominal wall hernia or abnormality. No abdominopelvic ascites. Musculoskeletal: No acute or significant osseous findings. Bilateral pars defects are noted at L5 IMPRESSION: Changes consistent with pancolitis as well as inflammatory change in the terminal ileum. These changes are consistent with the patient's given clinical history of ulcerative colitis. No evidence of perforation or abscess formation is seen. Electronically Signed   By: Inez Catalina M.D.   On: 03/09/2022 22:46    Scheduled Meds:  enoxaparin (LOVENOX) injection  40 mg Subcutaneous Q24H   methylPREDNISolone (SOLU-MEDROL) injection  60 mg Intravenous Q24H   Continuous Infusions:  lactated ringers 100 mL/hr at 03/09/22 2357    LOS: 0  days   Raiford Noble, DO [facility] Available via Epic secure chat 7am-7pm After these hours, please refer to coverage provider listed on [email or web address] 03/10/2022, 7:58 AM

## 2022-03-11 DIAGNOSIS — A020 Salmonella enteritis: Secondary | ICD-10-CM

## 2022-03-11 LAB — GASTROINTESTINAL PANEL BY PCR, STOOL (REPLACES STOOL CULTURE)

## 2022-03-11 LAB — CBC WITH DIFFERENTIAL/PLATELET
Abs Immature Granulocytes: 0.09 10*3/uL — ABNORMAL HIGH (ref 0.00–0.07)
Basophils Absolute: 0.1 10*3/uL (ref 0.0–0.1)
Basophils Relative: 0 %
Eosinophils Absolute: 0 10*3/uL (ref 0.0–0.5)
Eosinophils Relative: 0 %
HCT: 38.6 % (ref 36.0–46.0)
Hemoglobin: 12.6 g/dL (ref 12.0–15.0)
Immature Granulocytes: 1 %
Lymphocytes Relative: 13 %
Lymphs Abs: 1.7 10*3/uL (ref 0.7–4.0)
MCH: 29 pg (ref 26.0–34.0)
MCHC: 32.6 g/dL (ref 30.0–36.0)
MCV: 88.7 fL (ref 80.0–100.0)
Monocytes Absolute: 0.2 10*3/uL (ref 0.1–1.0)
Monocytes Relative: 1 %
Neutro Abs: 11.6 10*3/uL — ABNORMAL HIGH (ref 1.7–7.7)
Neutrophils Relative %: 85 %
Platelets: 469 10*3/uL — ABNORMAL HIGH (ref 150–400)
RBC: 4.35 MIL/uL (ref 3.87–5.11)
RDW: 13.5 % (ref 11.5–15.5)
WBC: 13.7 10*3/uL — ABNORMAL HIGH (ref 4.0–10.5)
nRBC: 0 % (ref 0.0–0.2)

## 2022-03-11 LAB — COMPREHENSIVE METABOLIC PANEL
ALT: 21 U/L (ref 0–44)
AST: 14 U/L — ABNORMAL LOW (ref 15–41)
Albumin: 3.3 g/dL — ABNORMAL LOW (ref 3.5–5.0)
Alkaline Phosphatase: 95 U/L (ref 38–126)
Anion gap: 9 (ref 5–15)
BUN: 12 mg/dL (ref 6–20)
CO2: 25 mmol/L (ref 22–32)
Calcium: 8.8 mg/dL — ABNORMAL LOW (ref 8.9–10.3)
Chloride: 101 mmol/L (ref 98–111)
Creatinine, Ser: 0.68 mg/dL (ref 0.44–1.00)
GFR, Estimated: >60 mL/min (ref >60)
Glucose, Bld: 130 mg/dL — ABNORMAL HIGH (ref 70–99)
Potassium: 4.4 mmol/L (ref 3.5–5.1)
Sodium: 135 mmol/L (ref 135–145)
Total Bilirubin: 0.3 mg/dL (ref 0.3–1.2)
Total Protein: 7.1 g/dL (ref 6.5–8.1)

## 2022-03-11 LAB — PHOSPHORUS: Phosphorus: 3.5 mg/dL (ref 2.5–4.6)

## 2022-03-11 LAB — MAGNESIUM: Magnesium: 1.9 mg/dL (ref 1.7–2.4)

## 2022-03-11 MED ORDER — ONDANSETRON HCL 4 MG PO TABS: 4.0000 mg | ORAL_TABLET | Freq: Four times a day (QID) | ORAL | 0 refills | Status: DC | PRN

## 2022-03-11 MED ORDER — PREDNISONE 10 MG PO TABS: ORAL_TABLET | ORAL | 0 refills | Status: AC

## 2022-03-11 MED ORDER — ACETAMINOPHEN 325 MG PO TABS: 650.0000 mg | ORAL_TABLET | Freq: Four times a day (QID) | ORAL | 0 refills | Status: DC | PRN

## 2022-03-12 LAB — CALPROTECTIN, FECAL: Calprotectin, Fecal: 2140 ug/g — ABNORMAL HIGH (ref 0–120)

## 2022-03-13 NOTE — Discharge Summary (Signed)
Physician Discharge Summary   Patient: Laura Vaughn MRN: [id number] DOB: 03-16-1995  Admit date:     03/09/2022  Discharge date: 03/11/2022  Discharge Physician: Raiford Noble, DO   PCP: Patient, No Pcp Per   Recommendations at discharge:   Follow-up with PCP within 1 to 2 weeks and repeat CBC, CMP, mag, Phos within 1 week Follow-up with gastroenterology Dr. Koleen Distance within 1 to 2 weeks and continue prednisone taper until seen by GI. Adequately orally hydrate given that the patient also tested positive for Salmonella on GI pathogen panel.  Discharge Diagnoses: Principal Problem:   Inflammatory bowel disease  Resolved Problems:   * No resolved hospital problems. [facility] Course: HPI per Dr. Mitzi Hansen on 03/09/22 Laura Vaughn is a 27 y.o. female with medical history significant for inflammatory bowel disease, now presenting to the emergency department with abdominal pain.    Patient developed nausea, fatigue, bloody diarrhea, and intermittent abdominal pain for which she was seen in the emergency department on 03/06/2022.  She was presumed to have flare in her IBD and was discharged home with Medrol Dosepak at that time.  She initially had some improvement, but now her pain has become severe again.  She continues to have diarrhea and nausea without vomiting.  She denies fevers, chills, or dysuria.  She was negative for C. difficile colitis on 03/06/2022.   She has not seen a gastroenterologist in over a year but has sought care from Marjie Skiff, a naturopathic doctor.  She was previously followed by GI at [facility] and reports that she had been treated with Lialda, Stelara, Entyvio, and Humira.   ED Course: Upon arrival to the ED, patient is found to be afebrile and saturating well on room air with slightly elevated heart rate and stable blood pressure.  Blood work is notable for WBC 17,000 and platelets 504,000.  CMP is unremarkable.  CT of the abdomen and pelvis is concerning for  pancolitis and inflammation at the terminal ileum.   Gastroenterology was consulted by the ED physician and the patient was given a liter of normal saline, morphine, Zofran, ciprofloxacin, and Flagyl.   **Interim History Was consulted and antibiotics have been discontinued.  Will continue IV Solu-Medrol and then transition to oral steroids tomorrow given that she is slowly improving.  She improved and she was able to be transition to oral steroids.  Further workup on the GI pathogen panel showed that she was positive for Salmonella.  Patient symptoms are improving so we will recommend continued supportive care.  She will need to follow-up with her primary gastroenterologist in outpatient setting given her inflammatory bowel disease flare.  She is medically stable at this time to follow-up with PCP as well as outpatient gastroenterology.   Assessment and Plan:  Inflammatory bowel disease with ulcerative colitis flare and concomitant Salmonella -Pt with IBD who was previously followed by Dr. Koleen Distance p/w increased pain and persistent bloody diarrhea after initial relief with Medrol dosepack and has pancolitis and inflamed terminal ileum on CT  -GI consulted by ED MD and IV fluids and IV antibiotics given in ED  -She remains on IV fluids with lactated Ringer's at 100 MLS per hour -Apparently she had initially been diagnosed with Crohn's disease at the time of her vaginal delivery in September [id number] which was complicated by Fournier's gangrene and required multiple surgeries at that time including a diverting sigmoid ostomy and then a colonoscopy in 2020 showed findings of ulcerative colitis.  Had a  takedown of the colostomy that was performed in October 2020 and she has been trialed on mesalamine, Humira, Entyvio in the past and took 1 dose of Stelara but was stopped after she developed C. difficile in 2022 and was lost to follow-up - Start IV Solu-Medrol and remains on 60 mg IV every 24, continue IVF  hydration and pain-control with acetaminophen 650 mg p.o./RC every 6 as needed, hydromorphone 0.5 to 1 mg every 3 hours as needed severe pain -Per my discussion with GI, GI recommends continue IV Solu-Medrol and then changing to oral prednisone tomorrow.  Recommending a standard taper of 40 mg once a day for a week followed by 30 mg once a day for a week followed by 20 mg once a day for a week and then 10 mg until seen by Dr. Koleen [facility] at [facility]. -CRP was 5.6 -Patient does not want start mesalamine or any treatment at this time. -Patient's symptoms improved and she was transitioned to oral prednisone taper as written by GI.  GI pathogen panel came back and she tested positive for Salmonella.  Will continue supportive care given that her symptoms are improving and she is able to tolerate and drink well and orally hydrate   Leukocytosis -WBC Trend: Recent Labs  Lab 03/06/22 1628 03/09/22 1934 03/10/22 0456 03/11/22 0411  WBC 10.6* 17.0* 14.3* 13.7*  -In the setting of IBD Flare and Steroid Demargination from Medrol Dosepak -Continue to Monitor for S/Sx of Infection;  -Repeat CBC in the AM    Hyperglycemia -In the setting of steroid demargination -Check HbA1c in the outpatient setting  -Continue to Monitor Blood Sugars carefully and if necessary will place on Sensitive Novolog SSI AC   Hyponatremia -Na+ Trend: Recent Labs  Lab 03/06/22 1628 03/09/22 1934 03/10/22 0456 03/11/22 0411  NA 132* 139 134* 135  -Continue to Monitor and Trend and Repeat CMP in the AM    Thrombocytosis -Likely Reactive Recent Labs  Lab 03/06/22 1628 03/09/22 1934 03/10/22 0456 03/11/22 0411  PLT 444* 504* 454* 469*  -Continue to Monitor and Trend and repeat CBC in the AM   Hypoalbuminemia -Patient's Albumin Trend: Recent Labs  Lab 03/06/22 1628 03/09/22 1934 03/10/22 0456 03/11/22 0411  ALBUMIN 4.2 4.1 3.3* 3.3*  -Continue to Monitor and Trend and repeat CMP in the   Consultants:  Gastroenterology Procedures performed: As delineated as above Disposition: Home Diet recommendation:  Discharge Diet Orders (From admission, onward)     Start     Ordered   03/11/22 0000  Diet general        03/11/22 1137           Regular diet DISCHARGE MEDICATION: Allergies as of 03/11/2022       Reactions   Almond (diagnostic)    Eggs Or Egg-derived Products         Medication List     STOP taking these medications    methylPREDNISolone 4 MG Tbpk tablet Commonly known as: MEDROL DOSEPAK       TAKE these medications    acetaminophen 325 MG tablet Commonly known as: TYLENOL Take 2 tablets (650 mg total) by mouth every 6 (six) hours as needed for mild pain (or Fever >/= 101).   albuterol 108 (90 Base) MCG/ACT inhaler Commonly known as: VENTOLIN HFA Inhale 2 puffs into the lungs every 6 (six) hours as needed for wheezing or shortness of breath.   ondansetron 4 MG tablet Commonly known as: ZOFRAN Take 1 tablet (4 mg  total) by mouth every 6 (six) hours as needed for nausea.   predniSONE 10 MG tablet Commonly known as: DELTASONE Take 4 tablets (40 mg total) by mouth daily for 7 days, THEN 3 tablets (30 mg total) daily for 7 days, THEN 2 tablets (20 mg total) daily for 7 days, THEN 1 tablet (10 mg total) daily for 21 days. Remain on 10 mg until evaluated by GI as an outpatient. Start taking on: March 11, 2022   PROBIOTIC DAILY PO Take 1 capsule by mouth daily.       Discharge Exam: Filed Weights   03/10/22 0030  Weight: 73.5 kg   Vitals:   03/10/22 2033 03/11/22 0530  BP: (!) 98/58 92/61  Pulse: 77 71  Resp: 18 18  Temp: 98.1 F (36.7 C) 97.7 F (36.5 C)  SpO2: 97% 97%   Examination: Physical Exam:  Constitutional: WN/WD Caucasian female in no acute distress Respiratory: Clear to auscultation bilaterally, no wheezing, rales, rhonchi or crackles. Normal respiratory effort and patient is not tachypenic. No accessory muscle use.   Cardiovascular: RRR, no murmurs / rubs / gallops. S1 and S2 auscultated. No extremity edema. 2+ pedal pulses. No carotid bruits.  Unlabored breathing Abdomen: Soft, non-tender, not appreciably distended.. Bowel sounds positive.  GU: Deferred. Musculoskeletal: No clubbing / cyanosis of digits/nails. No joint deformity upper and lower extremities.  Skin: No rashes, lesions, ulcers on limited skin evaluation. No induration; Warm and dry.  Neurologic: CN 2-12 grossly intact with no focal deficits. Romberg sign and cerebellar reflexes not assessed.  Psychiatric: Normal judgment and insight. Alert and oriented x 3. Normal mood and appropriate affect.   Condition at discharge: serious  The results of significant diagnostics from this hospitalization (including imaging, microbiology, ancillary and laboratory) are listed below for reference.   Imaging Studies: CT ABDOMEN PELVIS W CONTRAST  Result Date: 03/09/2022 CLINICAL DATA:  Abdominal pain and history of ulcerative colitis and Crohn's disease, initial encounter EXAM: CT ABDOMEN AND PELVIS WITH CONTRAST TECHNIQUE: Multidetector CT imaging of the abdomen and pelvis was performed using the standard protocol following bolus administration of intravenous contrast. RADIATION DOSE REDUCTION: This exam was performed according to the departmental dose-optimization program which includes automated exposure control, adjustment of the mA and/or kV according to patient size and/or use of iterative reconstruction technique. CONTRAST:  125m OMNIPAQUE IOHEXOL 300 MG/ML  SOLN COMPARISON:  03/08/2020 FINDINGS: Lower chest: No acute abnormality. Hepatobiliary: No focal liver abnormality is seen. No gallstones, gallbladder wall thickening, or biliary dilatation. Pancreas: Unremarkable. No pancreatic ductal dilatation or surrounding inflammatory changes. Spleen: Normal in size without focal abnormality. Adrenals/Urinary Tract: Adrenal glands are within normal limits.  Kidneys demonstrate a normal enhancement pattern. No renal calculi or obstructive changes are seen. The bladder is decompressed. Stomach/Bowel: Colon again demonstrates some diffuse wall thickening throughout its course similar to that seen on the prior exam consistent with diffuse colitis. This corresponds with the patient's given clinical history of ulcerative colitis. Stomach is within normal limits. The terminal ileum demonstrates some inflammatory change in hyperemia similar to that seen in the colon. More proximal small bowel appears within normal limits. The appendix is within normal limits. Vascular/Lymphatic: No significant vascular findings are present. No enlarged abdominal or pelvic lymph nodes. Reproductive: Uterus and bilateral adnexa are unremarkable. Other: No abdominal wall hernia or abnormality. No abdominopelvic ascites. Musculoskeletal: No acute or significant osseous findings. Bilateral pars defects are noted at L5 IMPRESSION: Changes consistent with pancolitis as well as inflammatory change in  the terminal ileum. These changes are consistent with the patient's given clinical history of ulcerative colitis. No evidence of perforation or abscess formation is seen. Electronically Signed   By: Inez Catalina M.D.   On: 03/09/2022 22:46    Microbiology: Results for orders placed or performed during the hospital encounter of 03/09/22  C Difficile Quick Screen w PCR reflex     Status: None   Collection Time: 03/10/22  8:28 AM   Specimen: STOOL  Result Value Ref Range Status   C Diff antigen NEGATIVE NEGATIVE Final   C Diff toxin NEGATIVE NEGATIVE Final   C Diff interpretation No C. difficile detected.  Final    Comment: Performed at [facility], [place] [address] [place], [place] [address]  Gastrointestinal Panel by PCR , Stool     Status: Abnormal   Collection Time: 03/10/22  8:28 AM   Specimen: Stool  Result Value Ref Range Status   Campylobacter species NOT DETECTED  NOT DETECTED Final   Plesimonas shigelloides NOT DETECTED NOT DETECTED Final   Salmonella species DETECTED (A) NOT DETECTED Final    Comment: RESULT CALLED TO, READ BACK BY AND VERIFIED WITH: RITA MLO AT 1022 03/11/22.PMF    Yersinia enterocolitica NOT DETECTED NOT DETECTED Final   Vibrio species NOT DETECTED NOT DETECTED Final   Vibrio cholerae NOT DETECTED NOT DETECTED Final   Enteroaggregative E coli (EAEC) NOT DETECTED NOT DETECTED Final   Enteropathogenic E coli (EPEC) NOT DETECTED NOT DETECTED Final   Enterotoxigenic E coli (ETEC) NOT DETECTED NOT DETECTED Final   Shiga like toxin producing E coli (STEC) NOT DETECTED NOT DETECTED Final   Shigella/Enteroinvasive E coli (EIEC) NOT DETECTED NOT DETECTED Final   Cryptosporidium NOT DETECTED NOT DETECTED Final   Cyclospora cayetanensis NOT DETECTED NOT DETECTED Final   Entamoeba histolytica NOT DETECTED NOT DETECTED Final   Giardia lamblia NOT DETECTED NOT DETECTED Final   Adenovirus F40/41 NOT DETECTED NOT DETECTED Final   Astrovirus NOT DETECTED NOT DETECTED Final   Norovirus GI/GII NOT DETECTED NOT DETECTED Final   Rotavirus A NOT DETECTED NOT DETECTED Final   Sapovirus (I, II, IV, and V) NOT DETECTED NOT DETECTED Final    Comment: Performed at [facility], [place] [place], [place] [address]  Calprotectin, Fecal     Status: Abnormal   Collection Time: 03/10/22  8:28 AM   Specimen: Stool  Result Value Ref Range Status   Calprotectin, Fecal 2,140 (H) 0 - 120 ug/g Final    Comment: (NOTE) **Results verified by repeat testing** Concentration     Interpretation   Follow-Up < 5 - 50 ug/g     Normal           None >50 -120 ug/g     Borderline       Re-evaluate in 4-6 weeks    >120 ug/g     Abnormal         Repeat as clinically                                   indicated Performed At: [facility] [place], [place] [id number] Rush Farmer MD [id number]    Labs: CBC: Recent Labs  Lab  03/09/22 1934 03/10/22 0456 03/11/22 0411  WBC 17.0* 14.3* 13.7*  NEUTROABS  --   --  11.6*  HGB 13.9 12.6 12.6  HCT 42.3 38.9 38.6  MCV 88.7 89.2 88.7  PLT 504* 454* [id number]*   Basic Metabolic Panel: Recent Labs  Lab 03/09/22 1934 03/10/22 0456 03/11/22 0411  NA 139 134* 135  K 3.7 3.7 4.4  CL 103 102 101  CO2 '26 23 25  '$ GLUCOSE 117* 129* 130*  BUN '12 10 12  '$ CREATININE 0.81 0.65 0.68  CALCIUM 9.1 8.4* 8.8*  MG  --  2.0 1.9  PHOS  --   --  3.5   Liver Function Tests: Recent Labs  Lab 03/09/22 1934 03/10/22 0456 03/11/22 0411  AST 16 13* 14*  ALT '29 24 21  '$ ALKPHOS 108 91 95  BILITOT 0.5 0.3 0.3  PROT 7.9 6.6 7.1  ALBUMIN 4.1 3.3* 3.3*   CBG: No results for input(s): "GLUCAP" in the last 168 hours.  Discharge time spent: greater than 30 minutes.  Signed: Raiford Noble, DO [facility] 03/13/2022

## 2022-09-15 LAB — OB RESULTS CONSOLE GC/CHLAMYDIA
Chlamydia: NEGATIVE
Neisseria Gonorrhea: NEGATIVE

## 2022-09-15 LAB — OB RESULTS CONSOLE ANTIBODY SCREEN: Antibody Screen: NEGATIVE

## 2022-09-15 LAB — OB RESULTS CONSOLE RUBELLA ANTIBODY, IGM: Rubella: IMMUNE

## 2022-09-15 LAB — OB RESULTS CONSOLE HEPATITIS B SURFACE ANTIGEN: Hepatitis B Surface Ag: NEGATIVE

## 2022-09-15 LAB — HEPATITIS C ANTIBODY: HCV Ab: NEGATIVE

## 2022-09-15 LAB — OB RESULTS CONSOLE RPR: RPR: NONREACTIVE

## 2022-09-15 LAB — OB RESULTS CONSOLE HIV ANTIBODY (ROUTINE TESTING): HIV: NONREACTIVE

## 2023-02-07 ENCOUNTER — Encounter (HOSPITAL_COMMUNITY): Payer: Self-pay | Admitting: *Deleted

## 2023-02-07 ENCOUNTER — Encounter (HOSPITAL_COMMUNITY): Payer: Self-pay

## 2023-02-07 NOTE — Patient Instructions (Signed)
Laura Vaughn  02/07/2023   Your procedure is scheduled on:  02/21/2023  Arrive at 0800 at Entrance C on [address] at [facility]  and [employer]. You are invited to use the FREE valet parking or use the Visitor's parking deck.  Pick up the phone at the desk and dial [phone].  Call this number if you have problems the morning of surgery: [phone]  Remember:   Do not eat food:(After Midnight) Desps de medianoche.  You may drink clear liquids until arrival at __0800___.  Clear liquids means a liquid you can see thru.  It can have color such as Cola or Kool aid.  Tea is OK and coffee as long as no milk or creamer of any kind.  Take these medicines the morning of surgery with A SIP OF WATER:  none   Do not wear jewelry, make-up or nail polish.  Do not wear lotions, powders, or perfumes. Do not wear deodorant.  Do not shave 48 hours prior to surgery.  Do not bring valuables to the hospital.  [facility] is not   responsible for any belongings or valuables brought to the hospital.  Contacts, dentures or bridgework may not be worn into surgery.  Leave suitcase in the car. After surgery it may be brought to your room.  For patients admitted to the hospital, checkout time is 11:00 AM the day of              discharge.      Please read over the following fact sheets that you were given:     Preparing for Surgery

## 2023-02-12 ENCOUNTER — Inpatient Hospital Stay (HOSPITAL_COMMUNITY)
Admission: AD | Admit: 2023-02-12 | Discharge: 2023-02-12 | Disposition: A | Discharge status: Home / Self Care | Payer: Medicaid Other | Attending: Obstetrics and Gynecology | Admitting: Obstetrics and Gynecology

## 2023-02-12 ENCOUNTER — Other Ambulatory Visit: Payer: Self-pay

## 2023-02-12 ENCOUNTER — Encounter (HOSPITAL_COMMUNITY): Payer: Self-pay | Admitting: Obstetrics and Gynecology

## 2023-02-12 DIAGNOSIS — Z3A37 37 weeks gestation of pregnancy: Secondary | ICD-10-CM | POA: Insufficient documentation

## 2023-02-12 DIAGNOSIS — O471 False labor at or after 37 completed weeks of gestation: Secondary | ICD-10-CM | POA: Diagnosis present

## 2023-02-12 NOTE — MAU Provider Note (Signed)
Ms. Laura Vaughn is a [id number] at [redacted]w[redacted]d seen in MAU for labor. RN labor check, not seen by provider. SVE by RN Dilation: 1 Effacement (%): 30 Cervical Position: Middle Station: -3 Presentation: Vertex Exam by:: Valla Leaver, RN   NST - FHR: 140 bpm / moderate variability / accels present / decels absent / TOCO: 1 UC  Plan:  D/C home with labor precautions Keep scheduled appt with [facility] on Tuesday 02/14/2023  Raelyn Mora, CNM  02/12/2023 11:29 PM

## 2023-02-12 NOTE — MAU Note (Signed)
.  Laura Vaughn is a 28 y.o. at [redacted]w[redacted]d here in MAU reporting: ctx 45-1 hour apart since 1400. Denies VB or LOF. +FM. Scheduled for repeat c/s on 2/4.   Onset of complaint: 1400 Pain score: 2 Vitals:   02/12/23 2225  BP: 115/72  Pulse: (!) 101  Resp: 16  Temp: 97.7 F (36.5 C)  SpO2: 98%     FHT: 168  Lab orders placed from triage: labor eval

## 2023-02-20 ENCOUNTER — Encounter (HOSPITAL_COMMUNITY)
Admission: RE | Admit: 2023-02-20 | Discharge: 2023-02-20 | Disposition: A | Discharge status: Home / Self Care | Payer: Medicaid Other | Source: Ambulatory Visit | Attending: Family Medicine | Admitting: Family Medicine

## 2023-02-20 DIAGNOSIS — Z01812 Encounter for preprocedural laboratory examination: Secondary | ICD-10-CM | POA: Insufficient documentation

## 2023-02-20 DIAGNOSIS — Z98891 History of uterine scar from previous surgery: Secondary | ICD-10-CM | POA: Insufficient documentation

## 2023-02-20 LAB — RPR: RPR Ser Ql: NONREACTIVE

## 2023-02-20 LAB — TYPE AND SCREEN
ABO/RH(D): O POS
Antibody Screen: NEGATIVE

## 2023-02-20 LAB — CBC
HCT: 38.5 % (ref 36.0–46.0)
Hemoglobin: 12.7 g/dL (ref 12.0–15.0)
MCH: 29.4 pg (ref 26.0–34.0)
MCHC: 33 g/dL (ref 30.0–36.0)
MCV: 89.1 fL (ref 80.0–100.0)
Platelets: 484 10*3/uL — ABNORMAL HIGH (ref 150–400)
RBC: 4.32 MIL/uL (ref 3.87–5.11)
RDW: 13.9 % (ref 11.5–15.5)
WBC: 10.3 10*3/uL (ref 4.0–10.5)
nRBC: 0 % (ref 0.0–0.2)

## 2023-02-20 NOTE — H&P (Signed)
Laura Vaughn is a 28 y.o. female presenting for scheduled cesarean section.  +Fms. Denies vaginal bleeding, leakage of fluid, or regular contractions.  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 after first delivery        Past Medical History:  Diagnosis Date   ADHD (attention deficit hyperactivity disorder)    Anemia    History of necrotizing fasciitis    vulvar and vaginal after last delivery   Scoliosis    Ulcerative colitis [facility])    Past Surgical History:  Procedure Laterality Date   ABDOMINAL DEBRIDEMENT  2018   x7   CESAREAN SECTION N/A 06/28/2019   Procedure: CESAREAN SECTION;  Surgeon: Lavina Hamman, MD;  Location: [facility] LD ORS;  Service: Obstetrics;  Laterality: N/A;  request RNFA   CESAREAN SECTION N/A 12/16/2020   Procedure: CESAREAN SECTION;  Surgeon: Edwinna Areola, DO;  Location: [facility] LD ORS;  Service: Obstetrics;  Laterality: N/A;  request 2nd scrub tech or RNFA   OSTOMY  2018   ostomy reversal  2020   VAGINA SURGERY     Family History: family history includes ADD / ADHD in her sister and sister; Bipolar disorder in her paternal grandmother; Diabetes in her father. Social History:  reports that she has never smoked. She has never used smokeless tobacco. She reports that she does not drink alcohol and does not use drugs.     Maternal Diabetes: No. Declined GTT 2/2 UC. Accuchecks wnl  Genetic Screening: Declined Maternal Ultrasounds/Referrals: Normal Fetal Ultrasounds or other Referrals:  None Maternal Substance Abuse:  No Significant Maternal Medications:  None Significant Maternal Lab Results:  Group  B Strep negative Number of Prenatal Visits:greater than 3 verified prenatal visits Maternal Vaccinations: Declines flu/COVID/tdap/RSV Other Comments:  None  Review of Systems  Constitutional:  Negative for chills and fever.  Respiratory:  Negative for shortness of breath.   Cardiovascular:  Negative for chest pain.  Gastrointestinal:  Positive for diarrhea. Negative for abdominal pain and constipation.  Genitourinary:  Positive for pelvic pain. Negative for vaginal bleeding and vaginal discharge.   History   unknown if currently breastfeeding. Exam Physical Exam Constitutional:      General: She is not in acute distress.    Appearance: Normal appearance.  HENT:     Head: Normocephalic and atraumatic.  Pulmonary:     Effort: Pulmonary effort is normal.  Abdominal:     Comments: gravid  Musculoskeletal:        General: Normal range of motion.  Skin:    General: Skin is warm and dry.  Neurological:     Mental Status: She is alert.  Psychiatric:        Mood and Affect: Mood normal.        Behavior: Behavior normal.     Prenatal labs: ABO, Rh: --/--/O POS (02/03 6045) Antibody: NEG (02/03 4098) Rubella: Immune (08/29 0000) RPR: Nonreactive (  08/29 0000)  HBsAg: Negative (08/29 0000)  HIV: Non-reactive (08/29 0000)  GBS:  negative  Assessment/Plan: 28 yo G4P3003 @ 39.0 here for repeat cesarean section  - Ancef  - Declines sterilization  - To OR when ready   Junius Creamer 02/20/2023, 1:15 PM

## 2023-02-21 ENCOUNTER — Inpatient Hospital Stay (HOSPITAL_COMMUNITY): Payer: Medicaid Other | Admitting: Anesthesiology

## 2023-02-21 ENCOUNTER — Encounter (HOSPITAL_COMMUNITY)
Admission: RE | Disposition: A | Discharge status: Home / Self Care | Payer: Self-pay | Source: Home / Self Care | Attending: Student

## 2023-02-21 ENCOUNTER — Other Ambulatory Visit: Payer: Self-pay

## 2023-02-21 ENCOUNTER — Inpatient Hospital Stay (HOSPITAL_COMMUNITY)
Admission: RE | Admit: 2023-02-21 | Discharge: 2023-02-22 | DRG: 787 | Disposition: A | Discharge status: Home / Self Care | Payer: Medicaid Other | Attending: Student | Admitting: Student

## 2023-02-21 ENCOUNTER — Encounter (HOSPITAL_COMMUNITY): Payer: Self-pay | Admitting: Student

## 2023-02-21 ENCOUNTER — Inpatient Hospital Stay (HOSPITAL_COMMUNITY): Payer: Self-pay | Admitting: Anesthesiology

## 2023-02-21 DIAGNOSIS — Z8759 Personal history of other complications of pregnancy, childbirth and the puerperium: Secondary | ICD-10-CM

## 2023-02-21 DIAGNOSIS — O9962 Diseases of the digestive system complicating childbirth: Secondary | ICD-10-CM | POA: Diagnosis present

## 2023-02-21 DIAGNOSIS — K5190 Ulcerative colitis, unspecified, without complications: Secondary | ICD-10-CM | POA: Diagnosis present

## 2023-02-21 DIAGNOSIS — Z8739 Personal history of other diseases of the musculoskeletal system and connective tissue: Secondary | ICD-10-CM | POA: Diagnosis not present

## 2023-02-21 DIAGNOSIS — D62 Acute posthemorrhagic anemia: Secondary | ICD-10-CM | POA: Diagnosis not present

## 2023-02-21 DIAGNOSIS — O3429 Maternal care due to uterine scar from other previous surgery: Secondary | ICD-10-CM | POA: Diagnosis present

## 2023-02-21 DIAGNOSIS — O34211 Maternal care for low transverse scar from previous cesarean delivery: Secondary | ICD-10-CM | POA: Diagnosis present

## 2023-02-21 DIAGNOSIS — O9081 Anemia of the puerperium: Secondary | ICD-10-CM | POA: Diagnosis not present

## 2023-02-21 DIAGNOSIS — Z3A39 39 weeks gestation of pregnancy: Secondary | ICD-10-CM

## 2023-02-21 DIAGNOSIS — Z833 Family history of diabetes mellitus: Secondary | ICD-10-CM | POA: Diagnosis not present

## 2023-02-21 DIAGNOSIS — Z98891 History of uterine scar from previous surgery: Principal | ICD-10-CM

## 2023-02-21 SURGERY — Surgical Case
Anesthesia: Spinal

## 2023-02-21 MED ORDER — PHENYLEPHRINE HCL-NACL 20-0.9 MG/250ML-% IV SOLN
INTRAVENOUS | Status: AC
Start: 2023-02-21 — End: ?
  Filled 2023-02-21: qty 250

## 2023-02-21 MED ORDER — ACETAMINOPHEN 10 MG/ML IV SOLN
INTRAVENOUS | Status: AC
  Filled 2023-02-21: qty 100

## 2023-02-21 MED ORDER — OXYCODONE HCL 5 MG PO TABS: 5.0000 mg | ORAL_TABLET | Freq: Once | ORAL | Status: DC | PRN

## 2023-02-21 MED ORDER — STERILE WATER FOR IRRIGATION IR SOLN
Status: DC | PRN
  Administered 2023-02-21: 1

## 2023-02-21 MED ORDER — PRENATAL MULTIVITAMIN CH
1.0000 | ORAL_TABLET | Freq: Every day | ORAL | Status: DC
  Filled 2023-02-21: qty 1

## 2023-02-21 MED ORDER — LACTATED RINGERS IV SOLN: INTRAVENOUS | Status: DC

## 2023-02-21 MED ORDER — MEPERIDINE HCL 25 MG/ML IJ SOLN: 6.2500 mg | INTRAMUSCULAR | Status: DC | PRN

## 2023-02-21 MED ORDER — WITCH HAZEL-GLYCERIN EX PADS: 1.0000 | MEDICATED_PAD | CUTANEOUS | Status: DC | PRN

## 2023-02-21 MED ORDER — ACETAMINOPHEN 500 MG PO TABS
1000.0000 mg | ORAL_TABLET | Freq: Four times a day (QID) | ORAL | Status: DC
  Administered 2023-02-21 – 2023-02-22 (×4): 1000 mg via ORAL
  Filled 2023-02-21 (×4): qty 2

## 2023-02-21 MED ORDER — OXYTOCIN-SODIUM CHLORIDE 30-0.9 UT/500ML-% IV SOLN
INTRAVENOUS | Status: AC
Start: 2023-02-21 — End: ?
  Filled 2023-02-21: qty 500

## 2023-02-21 MED ORDER — IBUPROFEN 600 MG PO TABS
600.0000 mg | ORAL_TABLET | Freq: Four times a day (QID) | ORAL | Status: DC
  Administered 2023-02-22: 600 mg via ORAL
  Filled 2023-02-21: qty 1

## 2023-02-21 MED ORDER — ONDANSETRON HCL 4 MG/2ML IJ SOLN
INTRAMUSCULAR | Status: AC
  Filled 2023-02-21: qty 2

## 2023-02-21 MED ORDER — SODIUM CHLORIDE 0.9% FLUSH: 3.0000 mL | INTRAVENOUS | Status: DC | PRN

## 2023-02-21 MED ORDER — BUPIVACAINE IN DEXTROSE 0.75-8.25 % IT SOLN
INTRATHECAL | Status: DC | PRN
  Administered 2023-02-21: 1.6 mL via INTRATHECAL

## 2023-02-21 MED ORDER — FENTANYL CITRATE (PF) 100 MCG/2ML IJ SOLN
INTRAMUSCULAR | Status: AC
  Filled 2023-02-21: qty 2

## 2023-02-21 MED ORDER — ACETAMINOPHEN 10 MG/ML IV SOLN
INTRAVENOUS | Status: DC | PRN
  Administered 2023-02-21: 1000 mg via INTRAVENOUS

## 2023-02-21 MED ORDER — OXYTOCIN-SODIUM CHLORIDE 30-0.9 UT/500ML-% IV SOLN
2.5000 [IU]/h | INTRAVENOUS | Status: AC
  Administered 2023-02-21: 2.5 [IU]/h via INTRAVENOUS
  Filled 2023-02-21: qty 500

## 2023-02-21 MED ORDER — SCOPOLAMINE 1 MG/3DAYS TD PT72
MEDICATED_PATCH | TRANSDERMAL | Status: AC
  Filled 2023-02-21: qty 1

## 2023-02-21 MED ORDER — OXYCODONE HCL 5 MG/5ML PO SOLN: 5.0000 mg | Freq: Once | ORAL | Status: DC | PRN

## 2023-02-21 MED ORDER — SENNOSIDES-DOCUSATE SODIUM 8.6-50 MG PO TABS
2.0000 | ORAL_TABLET | Freq: Every day | ORAL | Status: DC
  Filled 2023-02-21: qty 2

## 2023-02-21 MED ORDER — MORPHINE SULFATE (PF) 0.5 MG/ML IJ SOLN
INTRAMUSCULAR | Status: AC
  Filled 2023-02-21: qty 10

## 2023-02-21 MED ORDER — MENTHOL 3 MG MT LOZG: 1.0000 | LOZENGE | OROMUCOSAL | Status: DC | PRN

## 2023-02-21 MED ORDER — STERILE WATER FOR IRRIGATION IR SOLN
Status: DC | PRN
  Administered 2023-02-21: 100 mL

## 2023-02-21 MED ORDER — PHENYLEPHRINE HCL-NACL 20-0.9 MG/250ML-% IV SOLN
INTRAVENOUS | Status: DC | PRN
  Administered 2023-02-21: 60 ug/min via INTRAVENOUS

## 2023-02-21 MED ORDER — ZOLPIDEM TARTRATE 5 MG PO TABS: 5.0000 mg | ORAL_TABLET | Freq: Every evening | ORAL | Status: DC | PRN

## 2023-02-21 MED ORDER — OXYTOCIN-SODIUM CHLORIDE 30-0.9 UT/500ML-% IV SOLN
INTRAVENOUS | Status: DC | PRN
  Administered 2023-02-21: 400 mL via INTRAVENOUS

## 2023-02-21 MED ORDER — SODIUM CHLORIDE 0.9 % IV SOLN: 12.5000 mg | INTRAVENOUS | Status: DC | PRN

## 2023-02-21 MED ORDER — DEXAMETHASONE SODIUM PHOSPHATE 4 MG/ML IJ SOLN
INTRAMUSCULAR | Status: AC
  Filled 2023-02-21: qty 2

## 2023-02-21 MED ORDER — NALOXONE HCL 4 MG/10ML IJ SOLN: 1.0000 ug/kg/h | INTRAVENOUS | Status: DC | PRN

## 2023-02-21 MED ORDER — DIPHENHYDRAMINE HCL 25 MG PO CAPS: 25.0000 mg | ORAL_CAPSULE | ORAL | Status: DC | PRN

## 2023-02-21 MED ORDER — NALOXONE HCL 0.4 MG/ML IJ SOLN: 0.4000 mg | INTRAMUSCULAR | Status: DC | PRN

## 2023-02-21 MED ORDER — PHENYLEPHRINE 80 MCG/ML (10ML) SYRINGE FOR IV PUSH (FOR BLOOD PRESSURE SUPPORT)
PREFILLED_SYRINGE | INTRAVENOUS | Status: AC
  Filled 2023-02-21: qty 10

## 2023-02-21 MED ORDER — SIMETHICONE 80 MG PO CHEW: 80.0000 mg | CHEWABLE_TABLET | ORAL | Status: DC | PRN

## 2023-02-21 MED ORDER — FENTANYL CITRATE (PF) 100 MCG/2ML IJ SOLN
INTRAMUSCULAR | Status: DC | PRN
  Administered 2023-02-21: 15 ug via INTRATHECAL

## 2023-02-21 MED ORDER — ONDANSETRON HCL 4 MG/2ML IJ SOLN
INTRAMUSCULAR | Status: DC | PRN
  Administered 2023-02-21: 4 mg via INTRAVENOUS

## 2023-02-21 MED ORDER — DIPHENHYDRAMINE HCL 25 MG PO CAPS: 25.0000 mg | ORAL_CAPSULE | Freq: Four times a day (QID) | ORAL | Status: DC | PRN

## 2023-02-21 MED ORDER — KETOROLAC TROMETHAMINE 30 MG/ML IJ SOLN: 30.0000 mg | Freq: Once | INTRAMUSCULAR | Status: DC | PRN

## 2023-02-21 MED ORDER — METOCLOPRAMIDE HCL 5 MG/ML IJ SOLN
INTRAMUSCULAR | Status: AC
  Filled 2023-02-21: qty 2

## 2023-02-21 MED ORDER — COCONUT OIL OIL: 1.0000 | TOPICAL_OIL | Status: DC | PRN

## 2023-02-21 MED ORDER — CEFAZOLIN SODIUM-DEXTROSE 2-4 GM/100ML-% IV SOLN
INTRAVENOUS | Status: AC
  Filled 2023-02-21: qty 100

## 2023-02-21 MED ORDER — POVIDONE-IODINE 10 % EX SWAB
2.0000 | Freq: Once | CUTANEOUS | Status: AC
  Administered 2023-02-21: 2 via TOPICAL

## 2023-02-21 MED ORDER — SCOPOLAMINE 1 MG/3DAYS TD PT72
MEDICATED_PATCH | TRANSDERMAL | Status: DC | PRN
  Administered 2023-02-21: 1 via TRANSDERMAL

## 2023-02-21 MED ORDER — PHENYLEPHRINE HCL (PRESSORS) 10 MG/ML IV SOLN
INTRAVENOUS | Status: DC | PRN
  Administered 2023-02-21: 80 ug via INTRAVENOUS

## 2023-02-21 MED ORDER — OXYCODONE HCL 5 MG PO TABS: 5.0000 mg | ORAL_TABLET | ORAL | Status: DC | PRN

## 2023-02-21 MED ORDER — MORPHINE SULFATE (PF) 0.5 MG/ML IJ SOLN
INTRAMUSCULAR | Status: DC | PRN
  Administered 2023-02-21: 150 ug via INTRATHECAL

## 2023-02-21 MED ORDER — KETOROLAC TROMETHAMINE 30 MG/ML IJ SOLN
30.0000 mg | Freq: Four times a day (QID) | INTRAMUSCULAR | Status: AC
  Administered 2023-02-21 – 2023-02-22 (×3): 30 mg via INTRAVENOUS
  Filled 2023-02-21 (×3): qty 1

## 2023-02-21 MED ORDER — DIBUCAINE (PERIANAL) 1 % EX OINT: 1.0000 | TOPICAL_OINTMENT | CUTANEOUS | Status: DC | PRN

## 2023-02-21 MED ORDER — SIMETHICONE 80 MG PO CHEW
80.0000 mg | CHEWABLE_TABLET | Freq: Three times a day (TID) | ORAL | Status: DC
  Filled 2023-02-21: qty 1

## 2023-02-21 MED ORDER — CEFAZOLIN SODIUM-DEXTROSE 2-4 GM/100ML-% IV SOLN
2.0000 g | INTRAVENOUS | Status: AC
  Administered 2023-02-21: 2 g via INTRAVENOUS

## 2023-02-21 MED ORDER — DIPHENHYDRAMINE HCL 50 MG/ML IJ SOLN
12.5000 mg | INTRAMUSCULAR | Status: DC | PRN
  Administered 2023-02-21: 12.5 mg via INTRAVENOUS
  Filled 2023-02-21: qty 1

## 2023-02-21 MED ORDER — HYDROMORPHONE HCL 1 MG/ML IJ SOLN: 0.2500 mg | INTRAMUSCULAR | Status: DC | PRN

## 2023-02-21 MED ORDER — DEXAMETHASONE SODIUM PHOSPHATE 10 MG/ML IJ SOLN
INTRAMUSCULAR | Status: DC | PRN
  Administered 2023-02-21: 8 mg via INTRAVENOUS

## 2023-02-21 SURGICAL SUPPLY — 38 items
BENZOIN TINCTURE PRP APPL 2/3 (GAUZE/BANDAGES/DRESSINGS) IMPLANT
CHLORAPREP W/TINT 26 (MISCELLANEOUS) ×2 IMPLANT
CLAMP UMBILICAL CORD (MISCELLANEOUS) ×1 IMPLANT
CLOTH BEACON ORANGE TIMEOUT ST (SAFETY) ×1 IMPLANT
DERMABOND ADVANCED .7 DNX12 (GAUZE/BANDAGES/DRESSINGS) ×1 IMPLANT
DERMABOND ADVANCED .7 DNX6 (GAUZE/BANDAGES/DRESSINGS) IMPLANT
DRSG OPSITE POSTOP 4X10 (GAUZE/BANDAGES/DRESSINGS) ×1 IMPLANT
ELECT REM PT RETURN 9FT ADLT (ELECTROSURGICAL) ×1
ELECTRODE REM PT RTRN 9FT ADLT (ELECTROSURGICAL) ×1 IMPLANT
EXTRACTOR VACUUM BELL STYLE (SUCTIONS) IMPLANT
FORMULA NB 0-3 ENFAMIL (FORMULA) IMPLANT
GAUZE SPONGE 4X4 12PLY STRL LF (GAUZE/BANDAGES/DRESSINGS) IMPLANT
GLOVE BIOGEL PI IND STRL 6.5 (GLOVE) ×1 IMPLANT
GLOVE BIOGEL PI IND STRL 7.0 (GLOVE) ×2 IMPLANT
GLOVE SURG SS PI 6.0 STRL IVOR (GLOVE) ×1 IMPLANT
GOWN STRL REUS W/TWL LRG LVL3 (GOWN DISPOSABLE) ×2 IMPLANT
KIT ABG SYR 3ML LUER SLIP (SYRINGE) IMPLANT
MAT PREVALON FULL STRYKER (MISCELLANEOUS) IMPLANT
NDL BLUNT 18X1 FOR OR ONLY (NEEDLE) IMPLANT
NDL HYPO 25X5/8 SAFETYGLIDE (NEEDLE) IMPLANT
NEEDLE BLUNT 18X1 FOR OR ONLY (NEEDLE) ×1 IMPLANT
NEEDLE HYPO 25X5/8 SAFETYGLIDE (NEEDLE) IMPLANT
NS IRRIG 1000ML POUR BTL (IV SOLUTION) ×1 IMPLANT
PACK C SECTION WH (CUSTOM PROCEDURE TRAY) ×1 IMPLANT
PAD ABD 8X10 STRL (GAUZE/BANDAGES/DRESSINGS) IMPLANT
PAD OB MATERNITY 4.3X12.25 (PERSONAL CARE ITEMS) ×1 IMPLANT
RTRCTR C-SECT PINK 25CM LRG (MISCELLANEOUS) IMPLANT
SUT PDS AB 0 CTX 36 PDP370T (SUTURE) IMPLANT
SUT PLAIN ABS 2-0 CT1 27XMFL (SUTURE) IMPLANT
SUT PROLENE 1 CT (SUTURE) IMPLANT
SUT VIC AB 0 CT1 36 (SUTURE) ×2 IMPLANT
SUT VIC AB 0 CTX36XBRD ANBCTRL (SUTURE) ×2 IMPLANT
SUT VIC AB 2-0 CT1 TAPERPNT 27 (SUTURE) ×2 IMPLANT
SUT VIC AB 4-0 KS 27 (SUTURE) ×1 IMPLANT
SUT VICRYL+ 3-0 36IN CT-1 (SUTURE) ×1 IMPLANT
TOWEL OR 17X24 6PK STRL BLUE (TOWEL DISPOSABLE) ×1 IMPLANT
TRAY FOLEY W/BAG SLVR 14FR LF (SET/KITS/TRAYS/PACK) ×1 IMPLANT
WATER STERILE IRR 1000ML POUR (IV SOLUTION) ×1 IMPLANT

## 2023-02-21 NOTE — Interval H&P Note (Signed)
 History and Physical Interval Note:  02/21/2023 10:08 AM  Laura Vaughn  has presented today for surgery, with the diagnosis of repeat c-section.  The various methods of treatment have been discussed with the patient and family. After consideration of risks, benefits and other options for treatment, the patient has consented to  Procedure(s): CESAREAN SECTION (N/A) as a surgical intervention.  The patient's history has been reviewed, patient examined, no change in status, stable for surgery.  I have reviewed the patient's chart and labs.  Questions were answered to the patient's satisfaction.     [name]

## 2023-02-21 NOTE — Anesthesia Preprocedure Evaluation (Signed)
Anesthesia Evaluation  Patient identified by MRN, date of birth, ID band Patient awake    Reviewed: Allergy & Precautions, NPO status , Patient's Chart, lab work & pertinent test results  Airway Mallampati: I  TM Distance: >3 FB Neck ROM: Full    Dental no notable dental hx. (+) Teeth Intact, Dental Advisory Given   Pulmonary neg pulmonary ROS   Pulmonary exam normal breath sounds clear to auscultation       Cardiovascular Exercise Tolerance: Good Normal cardiovascular exam Rhythm:Regular Rate:Normal     Neuro/Psych  Headaches    GI/Hepatic PUD,,,  Endo/Other  negative endocrine ROS    Renal/GU Lab Results      Component                Value               Date                      CREATININE               0.69                11/27/2020                BUN                      7                   11/27/2020                NA                       132 (L)             11/27/2020                K                        4.5                 11/27/2020                CL                       103                 11/27/2020                CO2                      18 (L)              11/27/2020                Musculoskeletal negative musculoskeletal ROS (+)    Abdominal   Peds  Hematology Lab Results      Component                Value               Date                      WBC                      13.3 (H)  12/14/2020                HGB                      12.8                12/14/2020                HCT                      38.1                12/14/2020                MCV                      91.1                12/14/2020                PLT                      435 (H)             12/14/2020            T& S available   Anesthesia Other Findings   Reproductive/Obstetrics (+) Pregnancy                             Anesthesia Physical Anesthesia Plan  ASA: 2  Anesthesia Plan:  Spinal   Post-op Pain Management:    Induction:   PONV Risk Score and Plan: 2 and Treatment may vary due to age or medical condition and Ondansetron  Airway Management Planned: Natural Airway  Additional Equipment: None  Intra-op Plan:   Post-operative Plan:   Informed Consent: I have reviewed the patients History and Physical, chart, labs and discussed the procedure including the risks, benefits and alternatives for the proposed anesthesia with the patient or authorized representative who has indicated his/her understanding and acceptance.     Dental advisory given  Plan Discussed with: CRNA and Anesthesiologist  Anesthesia Plan Comments:         Anesthesia Quick Evaluation

## 2023-02-21 NOTE — Transfer of Care (Signed)
 Immediate Anesthesia Transfer of Care Note  Patient: Laura Vaughn  Procedure(s) Performed: CESAREAN SECTION  Patient Location: PACU  Anesthesia Type:Spinal  Level of Consciousness: awake, alert , and oriented  Airway & Oxygen Therapy: Patient Spontanous Breathing  Post-op Assessment: Report given to RN and Post -op Vital signs reviewed and stable  Post vital signs: Reviewed and stable  Last Vitals:  Vitals Value Taken Time  BP 99/61 02/21/23 1140  Temp    Pulse 75 02/21/23 1142  Resp 18 02/21/23 1142  SpO2 95 % 02/21/23 1142  Vitals shown include unfiled device data.  Last Pain:  Vitals:   02/21/23 0844  TempSrc: Oral  PainSc: 0-No pain         Complications: No notable events documented.

## 2023-02-21 NOTE — Anesthesia Procedure Notes (Signed)
 Spinal  Patient location during procedure: OB End time: 02/21/2023 10:08 AM Reason for block: surgical anesthesia Staffing Performed: anesthesiologist  Anesthesiologist: Cleotilde Butler Dade, MD Performed by: Cleotilde Butler Dade, MD Authorized by: Cleotilde Butler Dade, MD   Preanesthetic Checklist Completed: patient identified, IV checked, risks and benefits discussed, surgical consent, monitors and equipment checked, pre-op evaluation and timeout performed Spinal Block Patient position: sitting Prep: DuraPrep and site prepped and draped Patient monitoring: heart rate, cardiac monitor, continuous pulse ox and blood pressure Approach: midline Location: L3-4 Injection technique: single-shot Needle Needle type: Pencan  Needle gauge: 24 G Needle length: 10 cm Assessment Sensory level: T4 Events: CSF return

## 2023-02-21 NOTE — Anesthesia Postprocedure Evaluation (Signed)
 Anesthesia Post Note  Patient: Laura Vaughn  Procedure(s) Performed: CESAREAN SECTION     Patient location during evaluation: PACU Anesthesia Type: Spinal Level of consciousness: awake and alert Pain management: pain level controlled Vital Signs Assessment: post-procedure vital signs reviewed and stable Respiratory status: spontaneous breathing, nonlabored ventilation and respiratory function stable Cardiovascular status: blood pressure returned to baseline and stable Postop Assessment: no apparent nausea or vomiting Anesthetic complications: no   No notable events documented.  Last Vitals:  Vitals:   02/21/23 1429 02/21/23 1514  BP: 98/65 99/66  Pulse: 65 (!) 55  Resp: 18 17  Temp: (!) 36.3 C 36.5 C  SpO2:  98%    Last Pain:  Vitals:   02/21/23 1525  TempSrc:   PainSc: 0-No pain   Pain Goal:                   Butler Levander Pinal

## 2023-02-21 NOTE — Op Note (Signed)
 C-Section Operative Note  Date: 02/21/23  Preoperative Diagnosis: 1) IUP at [redacted] weeks gestation 2) Previous cesarean section x 2 3) Ulcerative colitis, without medication  Postoperative Diagnosis:  1) IUP at [redacted] weeks gestation 2) Previous cesarean section x 2 3) Ulcerative colitis, without medication  Procedure: Repeat low transverse cesarean section with double layered closure Surgeon: K.Kailani Brass, DO  Operative Findings: Dense subcutaneous and fascial scarring. Gravid uterus.High riding bladder. Thin lower uterine segment. Clear amniotic fluid. Viable female infant in cephalic position weighing 3550 with APGARS of 8 and 8 at 1 and 5 minutes, respectively. Normal fallopian tubes and ovaries bilaterally. Specimens: Placenta to L&D EBL 102 IVF 1000 UOP 300  Patient Course: Patient presents for repeat cesarean section. She has a history of extensive perineal trauma with non-healing wound with her first child and has had repeat c-sections with her following deliveries. She decided against sterilization today.  Consent:  R/B/A of cesarean section discussed with patient. Alternative would be vaginal delivery which would mean shorter postpartum stay and decreased risk of bleeding. Risks of cesarean section include but are not limited to infection of the uterus, pelvic organs, or skin, inadvertent injury to internal organs, such as bowel or bladder, vasculature, and nerves. If there is major injury, extensive surgery may be required. If injury is minor, it may be treated with relative ease and repaired at the time of injury. Discussed possibility of excessive blood loss and transfusion. If bleeding cannot be controlled using medical or minor surgical methods, a cesarean hysterectomy may be performed which would mean no future fertility. Patient accepts the possibility of blood transfusion, if necessary. Patient understands and agrees to move forward with section.   Operative Procedure: Patient was  taken to the operating room where spinal  anesthesia was found to be adequate by Allis clamp test. She was prepped and draped in the normal sterile fashion in the dorsal supine position with a leftward tilt. An appropriate time out was performed. A Pfannenstiel skin incision was then made with the scalpel and carried through to the underlying layer of fascia by sharp dissection and Bovie cautery. The fascia was nicked in the midline and the incision was extended laterally with Mayo scissors. The superior aspect of the incision was grasped Coker clamps and dissected off the underlying rectus muscles. In a similar fashion the inferior aspect was dissected off the rectus muscles. Rectus muscles were separated in the midline and the peritoneal cavity entered sharply with careful dissection. The peritoneal incision was then extended both superiorly and inferiorly with careful attention to avoid both bowel and bladder. The Alexis self-retaining wound retractor was then placed within the incision and the lower uterine segment exposed. The bladder flap was developed with Metzenbaum scissors and pushed away from the lower uterine segment. The lower uterine segment was then incised in a low transverse fashion and the cavity itself entered bluntly. The incision was extended bluntly. Amniotic sac was ruptured and fluid was noted to be clear in color. The infant's head was then lifted and delivered from the incision without difficulty using the standard movements. The remainder of the infant delivered with difficulty. The cord was clamped and cut after 1 minute. The infant was handed off to the waiting neonatal staff. The placenta was then spontaneously expressed from the uterus and the uterus cleared of all clots and debris with moist lap sponge. The uterine incision was then repaired in 2 layers the first layer was a running locked layer of 0-vicryl and the  second an imbricating layer of the same suture. The bladder was close  to the hysterotomy on the right and thus the bladder was backfilled with sterile milk. No leak of milk was seen. The tubes and ovaries were inspected and the gutters cleared of all clots and debris. The uterine incision was inspected and found to be hemostatic. All instruments and sponges as well as the Alexis retractor were then removed from the abdomen. The rectus muscles were then reapproximated with several interrupted mattress sutures of 2-0 Vicryl. The fascia was then closed with 0 Vicryl in a running fashion. Subcutaneous tissue was reapproximated with 3-0 plain in a running fashion. The skin was closed with a subcuticular stitch of 4-0 Vicryl on a Keith needle and then reinforced with Dermabond and a Honeycomb dressing. At the conclusion of the procedure all instruments and sponge counts were correct. Patient was taken to the recovery room in good condition with her baby accompanying her skin to skin.

## 2023-02-22 ENCOUNTER — Encounter (HOSPITAL_COMMUNITY): Payer: Self-pay | Admitting: Student

## 2023-02-22 LAB — CBC
HCT: 29.1 % — ABNORMAL LOW (ref 36.0–46.0)
Hemoglobin: 9.8 g/dL — ABNORMAL LOW (ref 12.0–15.0)
MCH: 29.7 pg (ref 26.0–34.0)
MCHC: 33.7 g/dL (ref 30.0–36.0)
MCV: 88.2 fL (ref 80.0–100.0)
Platelets: 349 10*3/uL (ref 150–400)
RBC: 3.3 MIL/uL — ABNORMAL LOW (ref 3.87–5.11)
RDW: 13.7 % (ref 11.5–15.5)
WBC: 15.5 10*3/uL — ABNORMAL HIGH (ref 4.0–10.5)
nRBC: 0 % (ref 0.0–0.2)

## 2023-02-22 MED ORDER — OXYCODONE HCL 5 MG PO TABS: 5.0000 mg | ORAL_TABLET | ORAL | 0 refills | Status: DC | PRN

## 2023-02-22 MED ORDER — ACETAMINOPHEN 500 MG PO TABS: 1000.0000 mg | ORAL_TABLET | Freq: Four times a day (QID) | ORAL | Status: AC

## 2023-02-22 NOTE — Progress Notes (Signed)
 Subjective: Postpartum Day 1: Cesarean Delivery Patient reports tolerating PO, + flatus, and no problems voiding.  Pain well controlled. Ambulating well. Lochia minimal. Strongly desires discharge home.  Objective: Vital signs in last 24 hours: Temp:  [96.6 F (35.9 C)-98.1 F (36.7 C)] 98 F (36.7 C) (02/05 0535) Pulse Rate:  [52-76] 64 (02/05 0535) Resp:  [14-20] 16 (02/05 0535) BP: (91-110)/(51-72) 91/58 (02/05 0535) SpO2:  [92 %-98 %] 98 % (02/05 0055) Weight:  [95.3 kg] 95.3 kg (02/04 0844)  Physical Exam:  General: alert, cooperative, and no distress Lochia: appropriate Uterine Fundus: firm Incision: honeycomb dressing in place. No strikethrough. DVT Evaluation: No evidence of DVT seen on physical exam. No significant calf/ankle edema.  Recent Labs    02/20/23 0922 02/22/23 0626  HGB 12.7 9.8*  HCT 38.5 29.1*    Assessment/Plan: 28 yo G4 now P4004 POD1 s/p scheduled repeat cesarean section @ 39.0  -status post Cesarean section. Doing well postoperatively.  -acute blood loss anemia: clinically significant for this admission. Hgb 12.7-9.8. Recommend starting PO iron at home. -ulcerative colitis: no meds -desires circumcision: Per peds, neonatal circumcision is contraindicated in setting of parental refusal of Vitamin K for baby.  Discharge home with standard precautions and return to clinic in 4-6 weeks. [name], MD 02/22/2023, 8:12 AM

## 2023-02-22 NOTE — Discharge Summary (Signed)
 Postpartum Discharge Summary  Date of Service updated 2/4/-02/22/23     Patient Name: Laura Vaughn DOB: [birth year] MRN: [id number]  Date of admission: 02/21/2023 Delivery date:02/21/2023 Delivering provider: CLAUDENE MORT A Date of discharge: 02/22/2023  Admitting diagnosis: Cesarean delivery due to previous difficult delivery, delivered, current hospitalization [O82, Z87.59] Intrauterine pregnancy: [redacted]w[redacted]d     Secondary diagnosis:  Principal Problem:   Cesarean delivery due to previous difficult delivery, delivered, current hospitalization Active Problems:   Uterine scar from previous surgery affecting pregnancy  Additional problems: ulcerative colitis    Discharge diagnosis: Term Pregnancy Delivered and Anemia                                              Post partum procedures: none Complications: None  Hospital course: Scheduled C/S   28 y.o. G4P4004 at [redacted]w[redacted]d was admitted to the hospital 02/21/2023 for scheduled cesarean section with the following indication:Prior Uterine Surgery.Delivery details are as follows:  Membrane Rupture Time/Date: 10:40 AM,02/21/2023  Delivery Method:C-Section, Low Transverse Operative Delivery:N/A Details of operation can be found in separate operative note.  Patient had a postpartum course complicated by nothing.  She is ambulating, tolerating a regular diet, passing flatus, and urinating well. Patient is discharged home in stable condition on  02/22/23        Newborn Data: Birth date:02/21/2023 Birth time:10:40 AM Gender:Female Living status:Living Apgars:8 ,8  Weight:3550 g    Magnesium  Sulfate received: No BMZ received: No Rhophylac:No MMR:No T-DaP: declined Flu: No RSV Vaccine received: No Transfusion:No Immunizations administered: There is no immunization history for the selected administration types on file for this patient.  Physical exam  Vitals:   02/21/23 1632 02/21/23 1900 02/22/23 0055 02/22/23 0535  BP: 99/60 (!) 94/51 99/66 (!)  91/58  Pulse: (!) 59 (!) 56 (!) 52 64  Resp: 18 18 18 16   Temp: (!) 97.5 F (36.4 C) 98 F (36.7 C) 97.9 F (36.6 C) 98 F (36.7 C)  TempSrc: Oral Oral Oral Oral  SpO2: 97%  98%   Weight:      Height:       General: alert, cooperative, and no distress Lochia: appropriate Uterine Fundus: firm Incision: Dressing is clean, dry, and intact DVT Evaluation: No evidence of DVT seen on physical exam. Labs: Lab Results  Component Value Date   WBC 15.5 (H) 02/22/2023   HGB 9.8 (L) 02/22/2023   HCT 29.1 (L) 02/22/2023   MCV 88.2 02/22/2023   PLT 349 02/22/2023      Latest Ref Rng & Units 03/11/2022    4:11 AM  CMP  Glucose 70 - 99 mg/dL 869   BUN 6 - 20 mg/dL 12   Creatinine 9.55 - 1.00 mg/dL 9.31   Sodium 864 - 854 mmol/L 135   Potassium 3.5 - 5.1 mmol/L 4.4   Chloride 98 - 111 mmol/L 101   CO2 22 - 32 mmol/L 25   Calcium 8.9 - 10.3 mg/dL 8.8   Total Protein 6.5 - 8.1 g/dL 7.1   Total Bilirubin 0.3 - 1.2 mg/dL 0.3   Alkaline Phos 38 - 126 U/L 95   AST 15 - 41 U/L 14   ALT 0 - 44 U/L 21    Edinburgh Score:    02/22/2023   12:39 PM  Edinburgh Postnatal Depression Scale Screening Tool  I have been able to  laugh and see the funny side of things. 0  I have looked forward with enjoyment to things. 0  I have blamed myself unnecessarily when things went wrong. 2  I have been anxious or worried for no good reason. 1  I have felt scared or panicky for no good reason. 1  Things have been getting on top of me. 1  I have been so unhappy that I have had difficulty sleeping. 0  I have felt sad or miserable. 1  I have been so unhappy that I have been crying. 2  The thought of harming myself has occurred to me. 0  Edinburgh Postnatal Depression Scale Total 8      After visit meds:  Allergies as of 02/22/2023       Reactions   Almond (diagnostic)    Unknown reactions    Egg-derived Products    Stomach pain        Medication List     TAKE these medications     acetaminophen  500 MG tablet Commonly known as: TYLENOL  Take 2 tablets (1,000 mg total) by mouth every 6 (six) hours. What changed:  medication strength how much to take when to take this reasons to take this   multivitamin-prenatal 27-0.8 MG Tabs tablet Take 1 tablet by mouth daily.   oxyCODONE  5 MG immediate release tablet Commonly known as: Roxicodone  Take 1 tablet (5 mg total) by mouth every 4 (four) hours as needed for severe pain (pain score 7-10).   PROBIOTIC DAILY PO Take 1 Scoop by mouth daily. Powder         Discharge home in stable condition Infant Feeding: Breast Infant Disposition:home with mother Discharge instruction: per After Visit Summary and Postpartum booklet. Activity: Advance as tolerated. Pelvic rest for 6 weeks.  Diet: routine diet Anticipated Birth Control: Unsure Postpartum Appointment:2 weeks Additional Postpartum F/U: Incision check 2 weeks Future Appointments:No future appointments. Follow up Visit:  Follow-up Information     [facility]. Schedule an appointment as soon as possible for a visit in 2 week(s).   Contact information: [address]  [address] [place] [address] [phone]                     02/22/2023 [name], MD

## 2023-02-22 NOTE — Progress Notes (Signed)
 Pt requests 24h d/c. Pediatrician informed.

## 2023-02-24 ENCOUNTER — Inpatient Hospital Stay (HOSPITAL_COMMUNITY)
Admission: AD | Admit: 2023-02-24 | Discharge: 2023-02-24 | Disposition: A | Discharge status: Home / Self Care | Payer: Medicaid Other | Attending: Obstetrics and Gynecology | Admitting: Obstetrics and Gynecology

## 2023-02-24 ENCOUNTER — Encounter (HOSPITAL_COMMUNITY): Payer: Self-pay | Admitting: Student

## 2023-02-24 DIAGNOSIS — R519 Headache, unspecified: Secondary | ICD-10-CM | POA: Diagnosis not present

## 2023-02-24 DIAGNOSIS — O9089 Other complications of the puerperium, not elsewhere classified: Secondary | ICD-10-CM

## 2023-02-24 DIAGNOSIS — O894 Spinal and epidural anesthesia-induced headache during the puerperium: Secondary | ICD-10-CM | POA: Insufficient documentation

## 2023-02-24 DIAGNOSIS — Z98891 History of uterine scar from previous surgery: Secondary | ICD-10-CM | POA: Insufficient documentation

## 2023-02-24 LAB — URINALYSIS, ROUTINE W REFLEX MICROSCOPIC
Bilirubin Urine: NEGATIVE
Glucose, UA: NEGATIVE mg/dL
Hgb urine dipstick: NEGATIVE
Ketones, ur: NEGATIVE mg/dL
Leukocytes,Ua: NEGATIVE
Nitrite: NEGATIVE
Protein, ur: NEGATIVE mg/dL
Specific Gravity, Urine: 1.009 (ref 1.005–1.030)
pH: 7 (ref 5.0–8.0)

## 2023-02-24 MED ORDER — METOCLOPRAMIDE HCL 10 MG PO TABS: 10.0000 mg | ORAL_TABLET | Freq: Three times a day (TID) | ORAL | 0 refills | Status: AC | PRN

## 2023-02-24 MED ORDER — OXYCODONE HCL 5 MG PO TABS: 5.0000 mg | ORAL_TABLET | Freq: Four times a day (QID) | ORAL | 0 refills | Status: AC | PRN

## 2023-02-24 MED ORDER — ACETAMINOPHEN-CAFFEINE 500-65 MG PO TABS
2.0000 | ORAL_TABLET | Freq: Once | ORAL | Status: AC
  Administered 2023-02-24: 2 via ORAL
  Filled 2023-02-24: qty 2

## 2023-02-24 MED ORDER — KETOROLAC TROMETHAMINE 60 MG/2ML IM SOLN
60.0000 mg | Freq: Once | INTRAMUSCULAR | Status: AC
  Administered 2023-02-24: 60 mg via INTRAMUSCULAR
  Filled 2023-02-24: qty 2

## 2023-02-24 MED ORDER — ASPIRIN-ACETAMINOPHEN-CAFFEINE 250-250-65 MG PO TABS: 2.0000 | ORAL_TABLET | Freq: Once | ORAL | Status: DC

## 2023-02-24 MED ORDER — CYCLOBENZAPRINE HCL 10 MG PO TABS: 10.0000 mg | ORAL_TABLET | Freq: Three times a day (TID) | ORAL | 0 refills | Status: AC | PRN

## 2023-02-24 NOTE — Discharge Instructions (Signed)
 For prevention of migraines in pregnancy: -Magnesium , 400mg  by mouth, once daily -Vitamin B2, 400mg  by mouth, once daily  For treatment of migraines in pregnancy: -take medication at the first sign of the pain of a headache, or the first sign of your aura -start with tylenol , ibuprofen , OR Excedrin  migraine with or without Reglan  10mg  -if no relief after 1-2 hours, can take Flexeril  10mg  -Do not take more than 4000 mg of tylenol  (acetaminophen ) per day

## 2023-02-24 NOTE — Progress Notes (Signed)
 Pt seen and evaluated for potential post dural puncture headache 4d after spinal for c section. Per pt, headache started 24h after section and is positional in nature- worse w/ sitting/standing. However, pt seen in MAU sitting straight up with baby and states headache is now minimal. She has had 2 other sections and never had headaches post-partum. Also does not normally have headaches ante/intrapartum. Does not drink caffeine  at home. She also stated that she feels like the headache waxes and wanes, and gets better when she is breastfeeding.  D/w anesthesiologist who performed her spinal and it was uneventful, with our normal 24G pencan.   I d/w patient potential options for PDPH, including conservative management and epidural blood patch. At this time, I'm not 100% sure that an epidural blood patch will alleviate her symptoms and I think since she is 4 days out from the c section and her pain decreased to a 3/10 with medications in MAU that it would be safest to try conservative management for a little longer before more invasive interventions. I advised her that increasing her oral intake and caffeine  intake should help, as well as resting as much as possible. She will be sent home with excedrin  tension headache.   Advised pt and husband that we are in house 24/7 and she can always return to MAU for epidural blood patch if her headache does not continue to improve.

## 2023-02-24 NOTE — MAU Note (Signed)
.  Laura Vaughn is a 28 y.o. at [facility] C/S on 02/21/23 here in MAU reporting: headache that has been intermittent since Wednesday. Her headache goes away when laying down and has not responded to medications. She denies any complications during her pregnancy or delivery.  Medications last taken at: - Ibuprofen  @1300   - Tylenol  @ 1000 - Oxycodone  @0800 , 1430   LMP: N/A Onset of complaint: Wednesday Pain score: 7/10 Vitals:   02/24/23 1514  BP: 118/76  Pulse: 82  Resp: 16  Temp: 98 F (36.7 C)  SpO2: 98%     FHT: N/A  Lab orders placed from triage: UA

## 2023-02-24 NOTE — MAU Provider Note (Signed)
 History     CSN: [id number]  Arrival date and time: 02/24/23 1455   None     Chief Complaint  Patient presents with   Headache   HPI Laura Vaughn is a 28 y.o. female who presents for headache. She is 3 days s/p RCS. Headache started on Wednesday. Reports constant frontal headache. Has been taking ibuprofen  & tylenol  without relief. Took dose of oxycodone  this afternoon without relief. Headache 2/10 when lying down, 10/10 when sitting upright or standing. Somewhat worse with lights but not true photophobia. No other aggravating factors.  Denies neck pain, n/v, neck stiffness. Reports some low back pain that she relates to how she's been sitting at home.   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         Past Medical History:  Diagnosis Date   ADHD (attention deficit hyperactivity disorder)    Anemia    History of necrotizing fasciitis    vulvar and vaginal after last delivery   Scoliosis    Ulcerative colitis [facility])     Past Surgical History:  Procedure Laterality Date   ABDOMINAL DEBRIDEMENT  2018   x7   CESAREAN SECTION N/A 06/28/2019   Procedure: CESAREAN SECTION;  Surgeon: Horacio Boas, MD;  Location: [facility] LD ORS;  Service: Obstetrics;  Laterality: N/A;  request RNFA   CESAREAN SECTION N/A 12/16/2020   Procedure: CESAREAN SECTION;  Surgeon: Delana Ted Morrison, DO;  Location: [facility] LD ORS;  Service: Obstetrics;  Laterality: N/A;  request 2nd scrub tech or RNFA   CESAREAN SECTION N/A 02/21/2023   Procedure: CESAREAN SECTION;  Surgeon: Claudene Larraine LABOR, DO;  Location: [facility] LD ORS;  Service: Obstetrics;  Laterality: N/A;   OSTOMY  2018   ostomy reversal  2020   VAGINA SURGERY      Family History  Problem Relation Age of  Onset   ADD / ADHD Sister    Bipolar disorder Paternal Grandmother    ADD / ADHD Sister    Diabetes Father     Social History   Tobacco Use   Smoking status: Never   Smokeless tobacco: Never  Vaping Use   Vaping status: Never Used  Substance Use Topics   Alcohol use: Never   Drug use: Never    Allergies:  Allergies  Allergen Reactions   Almond (Diagnostic)     Unknown reactions    Egg-Derived Products     Stomach pain    Medications Prior to Admission  Medication Sig Dispense Refill Last Dose/Taking   acetaminophen  (TYLENOL ) 500 MG tablet Take 2 tablets (1,000 mg total) by mouth every 6 (six) hours.   02/24/2023 at 10:00 AM   ibuprofen  (ADVIL ) 200 MG tablet Take 200 mg by mouth every 6 (six) hours as needed.   02/24/2023 at  1:00 PM   oxyCODONE  (ROXICODONE ) 5 MG immediate release tablet Take 1 tablet (5 mg total) by mouth every 4 (four) hours as needed for severe pain (pain score 7-10). 5 tablet 0 02/24/2023  at  2:30 PM   Prenatal Vit-Fe Fumarate-FA (MULTIVITAMIN-PRENATAL) 27-0.8 MG TABS tablet Take 1 tablet by mouth daily.      Probiotic Product (PROBIOTIC DAILY PO) Take 1 Scoop by mouth daily. Powder       Review of Systems  All other systems reviewed and are negative.  Physical Exam   Blood pressure 114/71, pulse 77, temperature 98 F (36.7 C), temperature source Oral, resp. rate 16, height 5' 9 (1.753 m), weight 92 kg, SpO2 98%, currently breastfeeding.  Physical Exam Vitals and nursing note reviewed.  Constitutional:      General: She is not in acute distress.    Appearance: She is well-developed. She is not ill-appearing.  HENT:     Head: Normocephalic and atraumatic.  Eyes:     General: No scleral icterus.       Right eye: No discharge.        Left eye: No discharge.     Conjunctiva/sclera: Conjunctivae normal.  Pulmonary:     Effort: Pulmonary effort is normal. No respiratory distress.  Neurological:     General: No focal deficit present.     Mental  Status: She is alert.  Psychiatric:        Mood and Affect: Mood normal.        Behavior: Behavior normal.     MAU Course  Procedures Results for orders placed or performed during the hospital encounter of 02/24/23 (from the past 24 hours)  Urinalysis, Routine w reflex microscopic -Urine, Clean Catch     Status: Abnormal   Collection Time: 02/24/23  3:21 PM  Result Value Ref Range   Color, Urine STRAW (A) YELLOW   APPearance CLEAR CLEAR   Specific Gravity, Urine 1.009 1.005 - 1.030   pH 7.0 5.0 - 8.0   Glucose, UA NEGATIVE NEGATIVE mg/dL   Hgb urine dipstick NEGATIVE NEGATIVE   Bilirubin Urine NEGATIVE NEGATIVE   Ketones, ur NEGATIVE NEGATIVE mg/dL   Protein, ur NEGATIVE NEGATIVE mg/dL   Nitrite NEGATIVE NEGATIVE   Leukocytes,Ua NEGATIVE NEGATIVE    MDM VS U/a Consult to anesthesia  Oral & IM meds given in MAU Prescription meds sent  Assessment and Plan   1. Postpartum headache   2. Cesarean delivery delivered    -Normotensive. Low suspicion for headache being related to PP preeclampsia -Initial evaluation concerning for spinal headache given positional nature. Dr. Finucane with anesthesia evaluated patient. Patient able to tolerate upright position at this time with mild headache. Given excedrin  tension & toradol  IM with resolution of headache.  -Rx reglan  & flexeril  to take for future headaches. Discussed management of headache at home including picking up excedrin  migraine or tension. Reviewed reasons to return to triage.  -Patient had 3rd c/section on 2/4. Was given 5# oxycodone . Used last of the pills this afternoon & requesting additional prescription for incisional pain. Given recent surgery, provided with 12# oxycodone . Discussed that oxycodone  should be used for breakthrough incisional pain & not used for headache.   Rocky Satterfield 02/24/2023, 3:27 PM

## 2023-02-28 ENCOUNTER — Telehealth (HOSPITAL_COMMUNITY): Payer: Self-pay

## 2023-02-28 NOTE — Telephone Encounter (Signed)
02/28/2023 1920  Name: Burnell Matlin MRN: [id number] DOB: 03/14/1995  Reason for Call:  Transition of Care Hospital Discharge Call  Contact Status: Patient Contact Status: Message  Language assistant needed:          Follow-Up Questions:    Inocente Salles Postnatal Depression Scale:  In the Past 7 Days:    PHQ2-9 Depression Scale:     Discharge Follow-up:    Post-discharge interventions: NA  Signature  Signe Colt
